# Patient Record
Sex: Female | Born: 1985
Health system: Southern US, Community
[De-identification: ages and names within clinical notes are randomized; demographics above are authoritative.]

## PROBLEM LIST (undated history)

## (undated) DIAGNOSIS — R51 Headache: Secondary | ICD-10-CM

## (undated) DIAGNOSIS — Z8489 Family history of other specified conditions: Secondary | ICD-10-CM

## (undated) DIAGNOSIS — G43909 Migraine, unspecified, not intractable, without status migrainosus: Secondary | ICD-10-CM

## (undated) DIAGNOSIS — Z8619 Personal history of other infectious and parasitic diseases: Secondary | ICD-10-CM

## (undated) DIAGNOSIS — Z87440 Personal history of urinary (tract) infections: Secondary | ICD-10-CM

## (undated) DIAGNOSIS — B019 Varicella without complication: Secondary | ICD-10-CM

## (undated) DIAGNOSIS — R519 Headache, unspecified: Secondary | ICD-10-CM

## (undated) HISTORY — PX: APPENDECTOMY: SHX54

## (undated) HISTORY — PX: TONSILLECTOMY: SUR1361

## (undated) HISTORY — DX: Personal history of other infectious and parasitic diseases: Z86.19

## (undated) HISTORY — PX: SPONTANEOUS HIP DISLOCATION REPAIR: SHX392

## (undated) HISTORY — DX: Personal history of urinary (tract) infections: Z87.440

## (undated) HISTORY — PX: FEMUR SURGERY: SHX943

## (undated) HISTORY — DX: Migraine, unspecified, not intractable, without status migrainosus: G43.909

## (undated) HISTORY — DX: Varicella without complication: B01.9

---
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
## 2006-12-13 ENCOUNTER — Encounter: Payer: Self-pay | Admitting: Family Medicine

## 2007-05-17 ENCOUNTER — Emergency Department (HOSPITAL_COMMUNITY): Admission: EM | Admit: 2007-05-17 | Discharge: 2007-05-17 | Payer: Self-pay | Admitting: Family Medicine

## 2007-08-08 ENCOUNTER — Encounter: Admission: RE | Admit: 2007-08-08 | Discharge: 2007-08-08 | Payer: Self-pay | Admitting: Gastroenterology

## 2008-03-29 LAB — CONVERTED CEMR LAB: Pap Smear: NORMAL

## 2008-10-27 ENCOUNTER — Ambulatory Visit: Payer: Self-pay | Admitting: Family Medicine

## 2009-09-20 ENCOUNTER — Encounter: Payer: Self-pay | Admitting: Family Medicine

## 2011-03-16 NOTE — H&P (Signed)
NAMEGINIA, Laura Gross NO.:  [id number]   MEDICAL RECORD NO.:  [id number]          PATIENT TYPE:  EMS   LOCATION:  ED                           FACILITY:  [facility]   PHYSICIAN:  Ollen Gross. Vernell Morgans, M.D. DATE OF BIRTH:  03-Apr-1986   DATE OF ADMISSION:  04/23/2005  DATE OF DISCHARGE:                                HISTORY & PHYSICAL   [name] is a 25 year old white female who presents with right lower  quadrant pain that started yesterday morning.  She denies any fevers or  chills.  No nausea.  She had one episode of vomiting.  No diarrhea or  dysuria.  Her white count has been normal.  Her other review of systems is  unremarkable.   PAST MEDICAL HISTORY:  None.   PAST SURGICAL HISTORY:  Significant for open reduction/internal fixation of  the left leg.   MEDICATIONS:  None.   ALLERGIES:  PENICILLIN.   SOCIAL HISTORY:  She denies the use of alcohol or tobacco products.   FAMILY HISTORY:  Noncontributory.   PHYSICAL EXAMINATION:  VITAL SIGNS:  Temperature 98, blood pressure 124/70,  pulse 79.  GENERAL:  She is a well-developed and well-nourished young white female in  no acute distress.  SKIN:  Warm and dry with no jaundice.  HEENT:  Extraocular muscles are intact.  Pupils are equal, round and  reactive to light.  Sclerae are anicteric.  LUNGS:  Clear bilaterally with no use of accessory respiratory muscles.  HEART:  Regular rate and rhythm with an impulse in the left chest.  ABDOMEN:  Soft.  She has some focal right lower quadrant pain but no  guarding or peritoneal signs.  No palpable mass or hepatosplenomegaly.  EXTREMITIES:  No clubbing, cyanosis or edema.  Good strength in her arms and  legs.  PSYCHOLOGICAL:  She is alert and oriented x 3 with no evidence of anxiety or  depression.   On review of her lab work, her white count was normal at 5000.  On reviewing  her CT scan, it does show an enlarged, inflamed appendix, consistent with  acute  appendicitis.   ASSESSMENT/PLAN:  This is a 25 year old white female with acute  appendicitis.  I have recommended appendectomy tonight.  I have discussed  with her and her family the risks and benefits of the operation as well as  some of the technical aspects, and she understands and wishes to proceed.  We will obtain routine preoperatively lab work and preparation will be  performed tonight.       PST/MEDQ  D:  04/23/2005  T:  04/23/2005  Job:  [id number]

## 2011-03-16 NOTE — Op Note (Signed)
NAMEAREEJ, TAYLER NO.:  [id number]   MEDICAL RECORD NO.:  [id number]          PATIENT TYPE:  INP   LOCATION:  1409                         FACILITY:  [facility]   PHYSICIAN:  Ollen Gross. Vernell Morgans, M.D. DATE OF BIRTH:  1986/04/24   DATE OF PROCEDURE:  04/23/2005  DATE OF DISCHARGE:                                 OPERATIVE REPORT   PREOPERATIVE DIAGNOSIS:  Acute appendicitis.   POSTOPERATIVE DIAGNOSIS:  Acute appendicitis.   PROCEDURES:  Laparoscopic appendectomy.   SURGEON:  Ollen Gross. Carolynne Edouard, M.D.   ANESTHESIA:  General endotracheal.   DESCRIPTION OF PROCEDURE:  After informed consent was obtained, the patient  was brought to the operating room, placed in the supine position on the  operating room table. After adequate induction of general anesthesia, the  patient's abdomen was prepped with Betadine and draped in the usual sterile  manner. The area below the umbilicus was infiltrated with 0.25% Marcaine. A  small incision was made with a 15 blade knife. This incision was carried  down through the subcutaneous tissue bluntly with a hemostat and [employer]  retractors until the linea alba was identified. The linea alba was incised  with a 15 blade knife. Each side was grasped with Kocher clamps and elevated  anteriorly. The preperitoneal space was then probed bluntly with a hemostat  to the peritoneum was opened and access was gained to the abdominal cavity.  A #0 Vicryl pursestring stitch was placed in the fascia surrounding the  opening, a Hasson cannula was placed through the opening and anchored in  place with the previously placed Vicryl pursestring stitch. The abdomen was  then insufflated with carbon dioxide without difficulty. The patient was  placed in Trendelenburg position, rotated slightly with the right side up.  The laparoscope was inserted through the Hasson cannula and the right lower  quadrant was inspected. Next the suprapubic area was infiltrated with  0.25%  Marcaine and a small incision was made with a 15 blade knife and 10 mm port  was placed bluntly through this incision into the abdominal cavity under  direct vision. Just in between the two ports was another site chosen for a 5  mm port. This area was infiltrated with 0.25% Marcaine and a small incision  was made with a 15 blade knife and a 5 mm port was placed bluntly through  this incision into the abdominal cavity under direct vision. The laparoscope  was then moved to the suprapubic port. A Glassman grasper and harmonic  scalpel were then used to examine the right lower quadrant. The right lower  quadrant had a small area of terminal ileum that appeared to be adhesed to  the pelvic sidewall but the bowel itself appeared to be normal. The appendix  was identified ad appeared to be slightly inflamed and enlarged. The  appendix was able to be grasped with a Glassman grasper and elevated  anteriorly. The mesoappendix was taken down sharply with the harmonic  scalpel until the base of the appendix at its junction with the cecum was  clear of any tissue. Next, as  the appendix was held anteriorly, a  laparoscopic GIA-45 stapler with a blue load was placed through the Hasson  cannula and across the base of the appendix at its junction with the cecum  and clamped. A minute was allowed to pass,  the stapling device was then  fired thereby dividing the base of the appendix between staple lines. Next a  laparoscopic bag was placed through the Hasson cannula and the appendix was  placed within the bag and bag was sealed. The right lower quadrant was  inspected. The staple line appeared to be hemostatic and healthy. The  abdomen was then irrigated with copious amounts of saline. The bag with the  appendix was then removed through the infraumbilical port without  difficulty. The fascial defect was closed with the previously placed Vicryl  pursestring stitch as well as with another  figure-of-eight #0 Vicryl stitch.  The rest of the ports were then removed under direct vision and the gas was  allowed to escape. All the ports were hemostatic. The  skin incisions were all closed with interrupted 4-0 Monocryl subcuticular  stitches. Benzoin and Steri-Strips were applied. The patient tolerated the  procedure well. At the end of case, all needle, sponge and instrument counts  were correct. The patient was then awakened and taken to the recovery room  in stable condition.       PST/MEDQ  D:  04/24/2005  T:  04/24/2005  Job:  [id number]

## 2014-07-28 LAB — OB RESULTS CONSOLE HIV ANTIBODY (ROUTINE TESTING): HIV: NONREACTIVE

## 2014-07-28 LAB — OB RESULTS CONSOLE GC/CHLAMYDIA
CHLAMYDIA, DNA PROBE: NEGATIVE
GC PROBE AMP, GENITAL: NEGATIVE

## 2014-07-28 LAB — OB RESULTS CONSOLE RPR: RPR: NONREACTIVE

## 2014-07-28 LAB — OB RESULTS CONSOLE ABO/RH: "RH Type ": POSITIVE

## 2014-07-28 LAB — OB RESULTS CONSOLE HEPATITIS B SURFACE ANTIGEN: Hepatitis B Surface Ag: NEGATIVE

## 2014-07-28 LAB — OB RESULTS CONSOLE RUBELLA ANTIBODY, IGM: RUBELLA: IMMUNE

## 2014-07-28 LAB — OB RESULTS CONSOLE ANTIBODY SCREEN: Antibody Screen: NEGATIVE

## 2014-09-10 ENCOUNTER — Other Ambulatory Visit: Payer: Self-pay | Admitting: Family Medicine

## 2014-09-10 DIAGNOSIS — Z8279 Family history of other congenital malformations, deformations and chromosomal abnormalities: Secondary | ICD-10-CM

## 2014-09-14 ENCOUNTER — Other Ambulatory Visit (HOSPITAL_COMMUNITY): Payer: Self-pay | Admitting: Obstetrics & Gynecology

## 2014-09-14 DIAGNOSIS — Z8279 Family history of other congenital malformations, deformations and chromosomal abnormalities: Secondary | ICD-10-CM

## 2014-09-28 ENCOUNTER — Telehealth: Payer: Self-pay

## 2014-09-28 NOTE — Telephone Encounter (Signed)
Melissa left v/m requesting cb; [facility]

## 2014-09-28 NOTE — Telephone Encounter (Signed)
Melissa with [place] [facility] with preregistration left v/m requesting contact # for pt. Left v/m requesting cb.

## 2014-09-28 NOTE — Telephone Encounter (Signed)
Melissa called back and spoke with Amber and contact # given as requested.

## 2014-10-01 ENCOUNTER — Encounter (HOSPITAL_COMMUNITY): Payer: Self-pay | Admitting: Obstetrics & Gynecology

## 2014-10-01 ENCOUNTER — Ambulatory Visit (HOSPITAL_COMMUNITY): Admission: RE | Admit: 2014-10-01 | Payer: Self-pay | Source: Ambulatory Visit

## 2014-10-01 ENCOUNTER — Ambulatory Visit (HOSPITAL_COMMUNITY): Payer: Self-pay

## 2014-10-01 ENCOUNTER — Encounter (HOSPITAL_COMMUNITY): Payer: Self-pay

## 2014-10-01 ENCOUNTER — Ambulatory Visit (HOSPITAL_COMMUNITY)
Admission: RE | Admit: 2014-10-01 | Discharge: 2014-10-01 | Disposition: A | Discharge status: Home / Self Care | Payer: Managed Care, Other (non HMO) | Source: Ambulatory Visit | Attending: Obstetrics & Gynecology | Admitting: Obstetrics & Gynecology

## 2014-10-01 DIAGNOSIS — Z3A18 18 weeks gestation of pregnancy: Secondary | ICD-10-CM | POA: Insufficient documentation

## 2014-10-01 DIAGNOSIS — Z36 Encounter for antenatal screening of mother: Secondary | ICD-10-CM | POA: Insufficient documentation

## 2014-10-01 DIAGNOSIS — Z8279 Family history of other congenital malformations, deformations and chromosomal abnormalities: Secondary | ICD-10-CM | POA: Insufficient documentation

## 2014-10-01 HISTORY — DX: Headache, unspecified: R51.9

## 2014-10-01 HISTORY — DX: Headache: R51

## 2014-10-01 LAB — US OB DETAIL + 14 WK

## 2014-10-15 ENCOUNTER — Other Ambulatory Visit (HOSPITAL_COMMUNITY): Payer: Self-pay

## 2014-10-29 NOTE — L&D Delivery Note (Signed)
SVD of VFI at 0522 on 03/10/15.  APGARs 9,9.  EBL 200cc.  Placenta to L&D. Head delivered OA followed atraumatically by body.  Cord was clamped, cut and baby to abdomen.  Cord blood was obtained.  Placenta delivered S/I/3VC.  Fundus was firmed with pitocin and massage.  2nd degree perineal laceration repaired with 3-0 Rapide.  Mom and baby stable.  Mitchel HonourMegan Kerigan Narvaez, DO

## 2014-11-13 ENCOUNTER — Inpatient Hospital Stay (HOSPITAL_COMMUNITY): Admission: AD | Admit: 2014-11-13 | Payer: Self-pay | Source: Ambulatory Visit | Admitting: Obstetrics & Gynecology

## 2014-11-16 ENCOUNTER — Other Ambulatory Visit (HOSPITAL_COMMUNITY): Payer: Self-pay

## 2015-01-05 ENCOUNTER — Encounter (HOSPITAL_COMMUNITY): Payer: Self-pay

## 2015-03-04 ENCOUNTER — Inpatient Hospital Stay (HOSPITAL_COMMUNITY): Admission: AD | Admit: 2015-03-04 | Payer: Self-pay | Source: Ambulatory Visit | Admitting: Obstetrics & Gynecology

## 2015-03-08 ENCOUNTER — Encounter (HOSPITAL_COMMUNITY): Payer: Self-pay | Admitting: *Deleted

## 2015-03-08 ENCOUNTER — Telehealth (HOSPITAL_COMMUNITY): Payer: Self-pay | Admitting: *Deleted

## 2015-03-08 LAB — OB RESULTS CONSOLE GBS: GBS: NEGATIVE

## 2015-03-08 NOTE — Telephone Encounter (Signed)
Preadmission screen  

## 2015-03-09 ENCOUNTER — Inpatient Hospital Stay (HOSPITAL_COMMUNITY): Payer: Managed Care, Other (non HMO) | Admitting: Anesthesiology

## 2015-03-09 ENCOUNTER — Inpatient Hospital Stay (HOSPITAL_COMMUNITY)
Admission: RE | Admit: 2015-03-09 | Discharge: 2015-03-11 | DRG: 775 | Disposition: A | Discharge status: Home / Self Care | Payer: Managed Care, Other (non HMO) | Source: Ambulatory Visit | Attending: Obstetrics & Gynecology | Admitting: Obstetrics & Gynecology

## 2015-03-09 DIAGNOSIS — O480 Post-term pregnancy: Principal | ICD-10-CM | POA: Diagnosis present

## 2015-03-09 DIAGNOSIS — Z3403 Encounter for supervision of normal first pregnancy, third trimester: Secondary | ICD-10-CM | POA: Diagnosis present

## 2015-03-09 DIAGNOSIS — Z3490 Encounter for supervision of normal pregnancy, unspecified, unspecified trimester: Secondary | ICD-10-CM

## 2015-03-09 DIAGNOSIS — Z3A40 40 weeks gestation of pregnancy: Secondary | ICD-10-CM | POA: Diagnosis present

## 2015-03-09 LAB — ABO/RH: ABO/RH(D): O POS

## 2015-03-09 LAB — TYPE AND SCREEN
ABO/RH(D): O POS
ANTIBODY SCREEN: NEGATIVE

## 2015-03-09 LAB — CBC
HEMATOCRIT: 34.8 % — AB (ref 36.0–46.0)
Hemoglobin: 12.2 g/dL (ref 12.0–15.0)
MCH: 31.4 pg (ref 26.0–34.0)
MCHC: 35.1 g/dL (ref 30.0–36.0)
MCV: 89.5 fL (ref 78.0–100.0)
Platelets: 175 10*3/uL (ref 150–400)
RBC: 3.89 MIL/uL (ref 3.87–5.11)
RDW: 13.4 % (ref 11.5–15.5)
WBC: 10.3 10*3/uL (ref 4.0–10.5)

## 2015-03-09 MED ORDER — LIDOCAINE HCL (PF) 1 % IJ SOLN
30.0000 mL | INTRAMUSCULAR | Status: DC | PRN
  Filled 2015-03-09: qty 30

## 2015-03-09 MED ORDER — EPHEDRINE 5 MG/ML INJ
10.0000 mg | INTRAVENOUS | Status: DC | PRN
  Filled 2015-03-09: qty 2

## 2015-03-09 MED ORDER — ZOLPIDEM TARTRATE 5 MG PO TABS: 5.0000 mg | ORAL_TABLET | Freq: Every evening | ORAL | Status: DC | PRN

## 2015-03-09 MED ORDER — DIPHENHYDRAMINE HCL 50 MG/ML IJ SOLN: 12.5000 mg | INTRAMUSCULAR | Status: DC | PRN

## 2015-03-09 MED ORDER — OXYTOCIN 40 UNITS IN LACTATED RINGERS INFUSION - SIMPLE MED
62.5000 mL/h | INTRAVENOUS | Status: DC
  Administered 2015-03-10: 62.5 mL/h via INTRAVENOUS

## 2015-03-09 MED ORDER — ACETAMINOPHEN 325 MG PO TABS: 650.0000 mg | ORAL_TABLET | ORAL | Status: DC | PRN

## 2015-03-09 MED ORDER — PHENYLEPHRINE 40 MCG/ML (10ML) SYRINGE FOR IV PUSH (FOR BLOOD PRESSURE SUPPORT)
80.0000 ug | PREFILLED_SYRINGE | INTRAVENOUS | Status: DC | PRN
  Administered 2015-03-09: 80 ug via INTRAVENOUS
  Filled 2015-03-09: qty 2
  Filled 2015-03-09: qty 20

## 2015-03-09 MED ORDER — FENTANYL 2.5 MCG/ML BUPIVACAINE 1/10 % EPIDURAL INFUSION (WH - ANES)
14.0000 mL/h | INTRAMUSCULAR | Status: DC | PRN
  Administered 2015-03-09 – 2015-03-10 (×2): 14 mL/h via EPIDURAL
  Filled 2015-03-09 (×2): qty 125

## 2015-03-09 MED ORDER — OXYTOCIN BOLUS FROM INFUSION
500.0000 mL | INTRAVENOUS | Status: DC
  Administered 2015-03-10: 500 mL via INTRAVENOUS

## 2015-03-09 MED ORDER — CITRIC ACID-SODIUM CITRATE 334-500 MG/5ML PO SOLN: 30.0000 mL | ORAL | Status: DC | PRN

## 2015-03-09 MED ORDER — LACTATED RINGERS IV SOLN
INTRAVENOUS | Status: DC
  Administered 2015-03-09 (×3): via INTRAVENOUS

## 2015-03-09 MED ORDER — LIDOCAINE HCL (PF) 1 % IJ SOLN
INTRAMUSCULAR | Status: DC | PRN
  Administered 2015-03-09: 6 mL
  Administered 2015-03-09: 4 mL

## 2015-03-09 MED ORDER — TERBUTALINE SULFATE 1 MG/ML IJ SOLN: 0.2500 mg | Freq: Once | INTRAMUSCULAR | Status: AC | PRN

## 2015-03-09 MED ORDER — OXYTOCIN 40 UNITS IN LACTATED RINGERS INFUSION - SIMPLE MED
1.0000 m[IU]/min | INTRAVENOUS | Status: DC
  Administered 2015-03-09: 2 m[IU]/min via INTRAVENOUS
  Filled 2015-03-09: qty 1000

## 2015-03-09 MED ORDER — ONDANSETRON HCL 4 MG/2ML IJ SOLN: 4.0000 mg | Freq: Four times a day (QID) | INTRAMUSCULAR | Status: DC | PRN

## 2015-03-09 MED ORDER — LACTATED RINGERS IV SOLN
500.0000 mL | INTRAVENOUS | Status: DC | PRN
  Administered 2015-03-09 (×2): 500 mL via INTRAVENOUS

## 2015-03-09 MED ORDER — FENTANYL 2.5 MCG/ML BUPIVACAINE 1/10 % EPIDURAL INFUSION (WH - ANES): 14.0000 mL/h | INTRAMUSCULAR | Status: DC | PRN

## 2015-03-09 MED ORDER — FLEET ENEMA 7-19 GM/118ML RE ENEM: 1.0000 | ENEMA | RECTAL | Status: DC | PRN

## 2015-03-09 MED ORDER — OXYCODONE-ACETAMINOPHEN 5-325 MG PO TABS: 1.0000 | ORAL_TABLET | ORAL | Status: DC | PRN

## 2015-03-09 MED ORDER — BUTORPHANOL TARTRATE 1 MG/ML IJ SOLN
1.0000 mg | INTRAMUSCULAR | Status: DC | PRN
  Administered 2015-03-09: 1 mg via INTRAVENOUS
  Filled 2015-03-09: qty 1

## 2015-03-09 MED ORDER — OXYCODONE-ACETAMINOPHEN 5-325 MG PO TABS: 2.0000 | ORAL_TABLET | ORAL | Status: DC | PRN

## 2015-03-09 NOTE — H&P (Signed)
Laura GarretStaci Gross is a [age] y.o. female presenting for post-dates induction.  FOB has congenital cardiac defect; patient declined fetal ECHO.  Otherwise, uncomplicated antepartum course.  GBS negative.  Maternal Medical History:  Fetal activity: Perceived fetal activity is normal.   Last perceived fetal movement was within the past hour.    Prenatal complications: no prenatal complications Prenatal Complications - Diabetes: none.    OB History    Gravida Para Term Preterm AB TAB SAB Ectopic Multiple Living   1 0 0 0 0 0 0 0 0 0      Past Medical History  Diagnosis Date  . Headache   . Hx of varicella    Past Surgical History  Procedure Laterality Date  . Appendectomy    . Tonsillectomy    . Femur surgery    . Spontaneous hip dislocation repair     Family History: family history includes Cancer in her maternal grandfather, maternal grandmother, and paternal grandmother; Diabetes in her paternal grandmother; Hyperlipidemia in her mother. Social History:  reports that she has never smoked. She has never used smokeless tobacco. She reports that she does not drink alcohol or use illicit drugs.   Prenatal Transfer Tool  Maternal Diabetes: No Genetic Screening: Normal Maternal Ultrasounds/Referrals: Normal Fetal Ultrasounds or other Referrals:  None Maternal Substance Abuse:  No Significant Maternal Medications:  None Significant Maternal Lab Results:  Lab values include: Group B Strep negative Other Comments:  None  ROS    Blood pressure 140/80, pulse 85, temperature 98.2 F (36.8 C), temperature source Oral, height 5\' 7"  (1.702 m), weight 237 lb (107.502 kg), last menstrual period 05/28/2014. Maternal Exam:  Abdomen: Fundal height is c/w dates.   Estimated fetal weight is 8#.       Physical Exam  Constitutional: She is oriented to person, place, and time. She appears well-developed and well-nourished.  GI: Soft. There is no rebound and no guarding.  Neurological:  She is alert and oriented to person, place, and time.  Skin: Skin is warm and dry.  Psychiatric: She has a normal mood and affect. Her behavior is normal.    Prenatal labs: ABO, Rh: O/Positive/-- (09/30 0000) Antibody: Negative (09/30 0000) Rubella: Immune (09/30 0000) RPR: Nonreactive (09/30 0000)  HBsAg: Negative (09/30 0000)  HIV: Non-reactive (09/30 0000)  GBS: Negative (05/10 0000)   Assessment/Plan: 29yo G1 at 10080w5d for PDI -Start pitocin -AROM when able -Epidural when ready   Sekai Nayak 03/09/2015, 2:39 PM

## 2015-03-09 NOTE — Progress Notes (Signed)
AROM, clear 2-3/75/-2  Continue pit Epidural if desired  Mitchel HonourMegan Jamieon Lannen, DO

## 2015-03-09 NOTE — Anesthesia Procedure Notes (Signed)

## 2015-03-09 NOTE — Anesthesia Preprocedure Evaluation (Addendum)

## 2015-03-10 ENCOUNTER — Inpatient Hospital Stay (HOSPITAL_COMMUNITY): Payer: 59

## 2015-03-10 ENCOUNTER — Encounter (HOSPITAL_COMMUNITY): Payer: Self-pay

## 2015-03-10 LAB — RPR: RPR: NONREACTIVE

## 2015-03-10 MED ORDER — OXYCODONE-ACETAMINOPHEN 5-325 MG PO TABS: 1.0000 | ORAL_TABLET | ORAL | Status: DC | PRN

## 2015-03-10 MED ORDER — PRENATAL MULTIVITAMIN CH
1.0000 | ORAL_TABLET | Freq: Every day | ORAL | Status: DC
  Administered 2015-03-11: 1 via ORAL
  Filled 2015-03-10: qty 1

## 2015-03-10 MED ORDER — ZOLPIDEM TARTRATE 5 MG PO TABS: 5.0000 mg | ORAL_TABLET | Freq: Every evening | ORAL | Status: DC | PRN

## 2015-03-10 MED ORDER — ACETAMINOPHEN 325 MG PO TABS: 650.0000 mg | ORAL_TABLET | ORAL | Status: DC | PRN

## 2015-03-10 MED ORDER — LANOLIN HYDROUS EX OINT: TOPICAL_OINTMENT | CUTANEOUS | Status: DC | PRN

## 2015-03-10 MED ORDER — TETANUS-DIPHTH-ACELL PERTUSSIS 5-2.5-18.5 LF-MCG/0.5 IM SUSP: 0.5000 mL | Freq: Once | INTRAMUSCULAR | Status: DC

## 2015-03-10 MED ORDER — ONDANSETRON HCL 4 MG/2ML IJ SOLN: 4.0000 mg | INTRAMUSCULAR | Status: DC | PRN

## 2015-03-10 MED ORDER — DIBUCAINE 1 % RE OINT: 1.0000 "application " | TOPICAL_OINTMENT | RECTAL | Status: DC | PRN

## 2015-03-10 MED ORDER — IBUPROFEN 600 MG PO TABS
600.0000 mg | ORAL_TABLET | Freq: Four times a day (QID) | ORAL | Status: DC
  Administered 2015-03-10 – 2015-03-11 (×6): 600 mg via ORAL
  Filled 2015-03-10 (×6): qty 1

## 2015-03-10 MED ORDER — WITCH HAZEL-GLYCERIN EX PADS: 1.0000 "application " | MEDICATED_PAD | CUTANEOUS | Status: DC | PRN

## 2015-03-10 MED ORDER — SIMETHICONE 80 MG PO CHEW: 80.0000 mg | CHEWABLE_TABLET | ORAL | Status: DC | PRN

## 2015-03-10 MED ORDER — DIPHENHYDRAMINE HCL 25 MG PO CAPS: 25.0000 mg | ORAL_CAPSULE | Freq: Four times a day (QID) | ORAL | Status: DC | PRN

## 2015-03-10 MED ORDER — ONDANSETRON HCL 4 MG PO TABS: 4.0000 mg | ORAL_TABLET | ORAL | Status: DC | PRN

## 2015-03-10 MED ORDER — OXYCODONE-ACETAMINOPHEN 5-325 MG PO TABS: 2.0000 | ORAL_TABLET | ORAL | Status: DC | PRN

## 2015-03-10 MED ORDER — BENZOCAINE-MENTHOL 20-0.5 % EX AERO
1.0000 "application " | INHALATION_SPRAY | CUTANEOUS | Status: DC | PRN
  Administered 2015-03-10: 1 via TOPICAL
  Filled 2015-03-10: qty 56

## 2015-03-10 MED ORDER — SENNOSIDES-DOCUSATE SODIUM 8.6-50 MG PO TABS
2.0000 | ORAL_TABLET | ORAL | Status: DC
  Administered 2015-03-10: 2 via ORAL
  Filled 2015-03-10: qty 2

## 2015-03-10 NOTE — Anesthesia Postprocedure Evaluation (Signed)
Anesthesia Post Note  Patient: Laura GarretStaci Gafford-Trevino  Procedure(s) Performed: * No procedures listed *  Anesthesia type: Epidural  Patient location: Mother/Baby  Post pain: Pain level controlled  Post assessment: Post-op Vital signs reviewed  Last Vitals:  Filed Vitals:   03/10/15 0631  BP: 132/76  Pulse: 86  Temp:   Resp:     Post vital signs: Reviewed  Level of consciousness: awake  Complications: No apparent anesthesia complications

## 2015-03-10 NOTE — Lactation Note (Signed)
This note was copied from the chart of Laura Gross. Lactation Consultation Note: Called to assist mom with latch- she reports that baby has been feeding well on the right breast for about 20 min, grandmother changing diaper as I went into room. Assisted mom with football hold on left breast- baby latched well nursed for 5 mins then off to sleep. Mom concerned that baby has not voided yet- reassurance given. No questions at present. To call prn  Patient Name: Laura [name] ZOXWR'UToday's Date: 03/10/2015 Reason for consult: Follow-up assessment   Maternal Data Formula Feeding for Exclusion: No Does the patient have breastfeeding experience prior to this delivery?: No  Feeding Feeding Type: Breast Fed Length of feed: 5 min  LATCH Score/Interventions Latch: Grasps breast easily, tongue down, lips flanged, rhythmical sucking.  Audible Swallowing: A few with stimulation  Type of Nipple: Everted at rest and after stimulation  Comfort (Breast/Nipple): Soft / non-tender     Hold (Positioning): Assistance needed to correctly position infant at breast and maintain latch. Intervention(s): Breastfeeding basics reviewed  LATCH Score: 8  Lactation Tools Discussed/Used     Consult Status Consult Status: Follow-up Date: 03/11/15 Follow-up type: In-patient    Pamelia HoitWeeks, Curstin Schmale D 03/10/2015, 2:40 PM

## 2015-03-10 NOTE — Progress Notes (Signed)
Post Partum Day 0 Subjective: no complaints, up ad lib, voiding and tolerating PO  Objective: Blood pressure 132/76, pulse 86, temperature 98.6 F (37 C), temperature source Oral, resp. rate 16, height 5\' 7"  (1.702 m), weight 237 lb (107.502 kg), last menstrual period 05/28/2014, SpO2 100 %, unknown if currently breastfeeding.  Physical Exam:  General: alert and cooperative Lochia: appropriate Uterine Fundus: firm Incision: healing well DVT Evaluation: No evidence of DVT seen on physical exam. Negative Homan's sign. No cords or calf tenderness. Calf/Ankle edema is present.   Recent Labs  03/09/15 1530  HGB 12.2  HCT 34.8*    Assessment/Plan: Plan for discharge tomorrow   LOS: 1 day   Kelli Robeck G 03/10/2015, 7:43 AM

## 2015-03-10 NOTE — Lactation Note (Signed)
This note was copied from the chart of Laura Marirose Gross. Lactation Consultation Note: Initial visit with mom- baby asleep in dad's arms at present. Mom reports that baby fed for 30 min after delivery with no pain. Reviewed feeding cues and encouraged to feed whenever she sees them. Attempted to wake baby but too sleepy. No questions at present. BF brochure given- reviewed [facility] and OP appointments as resources for support after DC. To call for assist prn  Patient Name: Laura [name] ZOXWR'UToday's Date: 03/10/2015 Reason for consult: Initial assessment   Maternal Data Formula Feeding for Exclusion: No Has patient been taught Hand Expression?: Yes Does the patient have breastfeeding experience prior to this delivery?: No  Feeding    LATCH Score/Interventions                      Lactation Tools Discussed/Used     Consult Status Consult Status: Follow-up Date: 03/11/15 Follow-up type: In-patient    Laura Gross, Laura Gross 03/10/2015, 11:15 AM

## 2015-03-11 LAB — CBC
HEMATOCRIT: 29.2 % — AB (ref 36.0–46.0)
Hemoglobin: 10 g/dL — ABNORMAL LOW (ref 12.0–15.0)
MCH: 30.8 pg (ref 26.0–34.0)
MCHC: 34.2 g/dL (ref 30.0–36.0)
MCV: 89.8 fL (ref 78.0–100.0)
PLATELETS: 139 10*3/uL — AB (ref 150–400)
RBC: 3.25 MIL/uL — ABNORMAL LOW (ref 3.87–5.11)
RDW: 13.5 % (ref 11.5–15.5)
WBC: 8.5 10*3/uL (ref 4.0–10.5)

## 2015-03-11 NOTE — Discharge Summary (Signed)
Obstetric Discharge Summary Reason for Admission: induction of labor Prenatal Procedures: ultrasound Intrapartum Procedures: spontaneous vaginal delivery Postpartum Procedures: none Complications-Operative and Postpartum: 2 degree perineal laceration HEMOGLOBIN  Date Value Ref Range Status  03/11/2015 10.0* 12.0 - 15.0 g/dL Final   HCT  Date Value Ref Range Status  03/11/2015 29.2* 36.0 - 46.0 % Final    Physical Exam:  General: alert and cooperative Lochia: appropriate Uterine Fundus: firm Incision: healing well DVT Evaluation: No evidence of DVT seen on physical exam. Negative Homan's sign. No cords or calf tenderness. No significant calf/ankle edema.  Discharge Diagnoses: Term Pregnancy-delivered  Discharge Information: Date: 03/11/2015 Activity: pelvic rest Diet: routine Medications: PNV and Ibuprofen Condition: stable Instructions: refer to practice specific booklet Discharge to: home   Newborn Data: Live born female  Birth Weight: 6 lb 5.2 oz (2870 g) APGAR: 9, 9  Baby under phototherapy.     Krystle Oberman G 03/11/2015, 8:22 AM

## 2015-03-12 ENCOUNTER — Ambulatory Visit: Payer: Self-pay

## 2015-03-12 NOTE — Lactation Note (Signed)
This note was copied from the chart of Laura Taquisha Gross. Lactation Consultation Note  Baby is feeding at the breast often.  Mom is pumping and offering any expressed BM after feedings.  I observed baby at the breast and noted that she appears to be chewing. Some swallows are noted.   Mom is sore and using comfort gels.  Going home on phototherapy so there will be a weight check tomorrow.  Encouraged feeding on cue, pumping and supplementing with expressed BM,  OUtpatient appointment scheduled for Wednesday.  Patient Name: Laura [name] WUJWJ'XToday's Date: 03/12/2015     Maternal Data    Feeding Feeding Type: Breast Fed  LATCH Score/Interventions Latch: Grasps breast easily, tongue down, lips flanged, rhythmical sucking.  Audible Swallowing: A few with stimulation Intervention(s): Skin to skin  Type of Nipple: Everted at rest and after stimulation  Comfort (Breast/Nipple): Filling, red/small blisters or bruises, mild/mod discomfort  Problem noted: Mild/Moderate discomfort Interventions (Mild/moderate discomfort): Pre-pump if needed  Hold (Positioning): No assistance needed to correctly position infant at breast. Intervention(s): Breastfeeding basics reviewed  LATCH Score: 8  Lactation Tools Discussed/Used     Consult Status      Soyla DryerJoseph, Shanzay Hepworth 03/12/2015, 11:53 AM

## 2015-03-15 ENCOUNTER — Ambulatory Visit (HOSPITAL_COMMUNITY)
Admission: RE | Admit: 2015-03-15 | Discharge: 2015-03-15 | Disposition: A | Discharge status: Home / Self Care | Payer: Managed Care, Other (non HMO) | Source: Ambulatory Visit | Attending: Obstetrics & Gynecology | Admitting: Obstetrics & Gynecology

## 2015-03-15 NOTE — Lactation Note (Addendum)
Lactation Consult  Mother's reason for visit:  Follow up appt was scheduled on day of discharge. Infant had an 8% weight loss on discharge.  Visit Type:feeding assessment    Appointment Notes: Mother states that infant lost 8% by day 3 of life. Mother states that infant is feeding well every 2-3 hours for 30-45 mins. Mother states that infant has regained  to 6-3. She was seen by Dr Athena MasseBonney yesterday for weight check. Infant was Jaundice and was on doublephoto tx at home. Mother states that she supplemented with formula until milk came in on day 4.  Consult:  Initial Lactation Consultant:  Michel BickersKendrick, Cherese Lozano McCoy  ________________________________________________________________________  Laura FloresBaby's Name: Laura Gross Date of Birth: 03/10/2014 Pediatrician:Dr Athena MasseBonney Gender: female Gestational Age: 3872w6d (At Birth) Birth Weight: 6 lb 5.2 oz (2870 g) Weight at Discharge: Weight: 5 lb 14 oz (2665 g)Date of Discharge: 03/12/2015 Filed Weights   03/10/15 0522 03/10/15 2300 03/11/15 2300  Weight: 6 lb 5.2 oz (2870 g) 6 lb 2.6 oz (2795 g) 5 lb 14 oz (2665 g)   Weight today: 6-4, 2836     Admission Information     Provider Service Admission Date    Vivia BirminghamAngela C Hartsell, MD Newborn 03/10/2015          ADT Events       Unit Room Bed Service Event    03/10/15 0522 [facility] 9197 [id number] Newborn Admission    03/10/15 0533 [facility] 9197 [id number] Newborn Transfer Out    03/10/15 0533 [facility] 9150 [id number] Newborn Transfer In    03/11/15 1207 [facility] 9150 [id number] Newborn Patient Update    03/12/15 1410 [facility] 9150 [id number] Newborn Discharge      Weight Information (last 3 days) before discharge     Date/Time   Weight   BSA (Calculated - sq m)   BMI (Calculated) Who      03/11/15 2300  5 lb 14 oz (2665 g)  --  -- CM     03/10/15 2300  6 lb 2.6 oz (2795 g)  --  -- CM     03/10/15 0522  6 lb 5.2 oz (2870 g)   --  -- CB           Weights    Go to now       03/07/15 - Today         One Day Eight Hours  View All    05/12 05/12 05/13    Time:  0522 2300 2300    Weight   Weight  6 lb 5... 6 lb 2... 5 lb 1...  Weight               ________________________________________________________________________  Mother's Name: Laura Gross Type of delivery: vaginal del  Breastfeeding Experience:  none Maternal Medical Conditions:  none Maternal Medications:  Prenatal Vits, tylenol  ________________________________________________________________________  Breastfeeding History (Post Discharge)  Frequency of breastfeeding:  Every 2-3 hours Duration of feeding:  30-45 mins  Patient does not supplement or pump.  Infant Intake and Output Assessment  Voids:   in 24 hrs.  Color:  Clear yellow Stools:   in 24 hrs.  Color:  Brown and Yellow  ________________________________________________________________________  Maternal Breast Assessment - Breast:  Full Nipple:  Erect Pain level:  0 Pain interventions:  Bra  _______________________________________________________________________ Feeding Assessment/Evaluation:  Infant was last fed 2 hours ago. Mother states that Laura Gross had a good feeding at 7:30.  Mother request latch assistance. She states that when infant  initially latches she has nipple pain. Mother taught asymmetrical latch technique. Infant latched and sustained latch for 15-20 mins. Observed lots of chewing and non-nutritive suckling. Infant transferred 16 ml.   Infant's oral assessment:  Variance,  Observed that infant has a short frenula with a possible tight posterior tie. Husband had a tongue tie when he was an infant that was revised.  Positioning:  Cross cradle Left breast  LATCH documentation:  Latch:  2 = Grasps breast easily, tongue down, lips flanged, rhythmical sucking.  Audible swallowing:  2 = Spontaneous and intermittent  Type of nipple:   2 = Everted at rest and after stimulation  Comfort (Breast/Nipple):  1 = Filling, red/small blisters or bruises, mild/mod discomfort  Hold (Positioning):  1 = Assistance needed to correctly position infant at breast and maintain latch  LATCH score:  8  Attached assessment:  Shallow  Lips flanged:  No.  Lips untucked:  No.   Suck assessment:  Nutritive and Nonnutritive  Tools:  Pump Instructed on use and cleaning of tool:  No.  Pre-feed weight: 2836 g 6-4 Post-feed weight: 2852 g 6-4.6 Amount transferred: 16 ml   Additional Feeding Assessment - infant was placed on alternate breast and assist with latch technique. Infant was observed with only a few swallows with a transfer of 8 ml.   Infant's oral assessment:  Variance  Positioning:  Cross cradle Right breast  LATCH documentation:  Latch:  2 = Grasps breast easily, tongue down, lips flanged, rhythmical sucking.  Audible swallowing:  2 = Spontaneous and intermittent  Type of nipple:  2 = Everted at rest and after stimulation  Comfort (Breast/Nipple):  1 = Filling, red/small blisters or bruises, mild/mod discomfort  Hold (Positioning):  1 = Assistance needed to correctly position infant at breast and maintain latch  LATCH score:  8  Attached assessment:  Shallow  Lips flanged:  No.  Lips untucked:  No.  Suck assessment:  Nutritive and Nonnutritive    Pre-feed weight: 2852 g   Post-feed weight: 2860  g  Amount transferred:  8 ml    Total amount transferred: 24 ml  Advised mother to continue to feed infant 8-12 feedings in 24 hours as well as with feeding cues Suggested that mother work on good depth with asymmetrical latch technique Continue to post pump at least 3 times daily to increase milk volume Advised to follow up with Dr Athena MasseBonney for evaluation of posterior tongue tie and possible referral to a specialist appt with Dr Athena MasseBonney on May 19 Phone LC for appt next week or follow up for weight check at Dr office  Bonner Puna/[facility]

## 2015-03-16 ENCOUNTER — Ambulatory Visit (HOSPITAL_COMMUNITY): Payer: 59

## 2015-03-19 ENCOUNTER — Encounter (HOSPITAL_COMMUNITY): Payer: Self-pay | Admitting: *Deleted

## 2015-03-19 ENCOUNTER — Inpatient Hospital Stay (HOSPITAL_COMMUNITY)
Admission: AD | Admit: 2015-03-19 | Discharge: 2015-03-19 | Disposition: A | Discharge status: Home / Self Care | Payer: Managed Care, Other (non HMO) | Source: Ambulatory Visit | Attending: Obstetrics and Gynecology | Admitting: Obstetrics and Gynecology

## 2015-03-19 DIAGNOSIS — O8622 Infection of bladder following delivery: Secondary | ICD-10-CM | POA: Diagnosis not present

## 2015-03-19 DIAGNOSIS — O8620 Urinary tract infection following delivery, unspecified: Secondary | ICD-10-CM | POA: Insufficient documentation

## 2015-03-19 DIAGNOSIS — Z880 Allergy status to penicillin: Secondary | ICD-10-CM | POA: Diagnosis not present

## 2015-03-19 DIAGNOSIS — R109 Unspecified abdominal pain: Secondary | ICD-10-CM | POA: Diagnosis present

## 2015-03-19 LAB — CBC
HCT: 34.6 % — ABNORMAL LOW (ref 36.0–46.0)
Hemoglobin: 12.2 g/dL (ref 12.0–15.0)
MCH: 31.4 pg (ref 26.0–34.0)
MCHC: 35.3 g/dL (ref 30.0–36.0)
MCV: 88.9 fL (ref 78.0–100.0)
Platelets: 237 10*3/uL (ref 150–400)
RBC: 3.89 MIL/uL (ref 3.87–5.11)
RDW: 12.9 % (ref 11.5–15.5)
WBC: 10.8 10*3/uL — ABNORMAL HIGH (ref 4.0–10.5)

## 2015-03-19 LAB — URINALYSIS, ROUTINE W REFLEX MICROSCOPIC
GLUCOSE, UA: NEGATIVE mg/dL
KETONES UR: 15 mg/dL — AB
Nitrite: POSITIVE — AB
Protein, ur: 100 mg/dL — AB
SPECIFIC GRAVITY, URINE: 1.015 (ref 1.005–1.030)
Urobilinogen, UA: 1 mg/dL (ref 0.0–1.0)
pH: 8.5 — ABNORMAL HIGH (ref 5.0–8.0)

## 2015-03-19 LAB — URINE MICROSCOPIC-ADD ON

## 2015-03-19 MED ORDER — CIPROFLOXACIN HCL 250 MG PO TABS: 250.0000 mg | ORAL_TABLET | Freq: Two times a day (BID) | ORAL | Status: DC

## 2015-03-19 NOTE — MAU Note (Signed)
Since last night having lower abd pain and has only gotten worse. Had normal BM yesterday but hurts lower abd when have BM. Dysuria. Did home test for uti and was positive but on-call wanted her to come in due to pain. Still some vag bleeding but no foul odor and bleeding getting darker

## 2015-03-19 NOTE — Progress Notes (Signed)
Eveline KetoKathy Harris NP in to discuss d/c plan. Written and verbal d/c instructions given and understanding voiced.

## 2015-03-19 NOTE — MAU Provider Note (Signed)
History     CSN: [id number]  Arrival date and time: 03/19/15 [id number]   First Provider Initiated Contact with Patient 03/19/15 1944      Chief Complaint  Patient presents with  . Abdominal Pain   HPI  Laura Gross is a 29 y.o. G1P1001 s/p SVD 5/12 presents with c/o bil low abd pain. The pain started yesterday, has progressively gotten worse, it hurts to walk, stand up straight.  The pain increases with voiding and with BM last evening. She denies urinary frequency(only voided x2 today) and urgency, has abd pain with voiding. No fever/chills, N&V, constipation or diarrhea. Still has scant dark lochia.   OB History    Gravida Para Term Preterm AB TAB SAB Ectopic Multiple Living   1 1 1  0 0 0 0 0 0 1      Past Medical History  Diagnosis Date  . Headache   . Hx of varicella     Past Surgical History  Procedure Laterality Date  . Appendectomy    . Tonsillectomy    . Femur surgery    . Spontaneous hip dislocation repair      Family History  Problem Relation Age of Onset  . Hyperlipidemia Mother   . Cancer Maternal Grandmother     breast  . Cancer Maternal Grandfather     nelanoma  . Diabetes Paternal Grandmother   . Cancer Paternal Grandmother     lung    History  Substance Use Topics  . Smoking status: Never Smoker   . Smokeless tobacco: Never Used  . Alcohol Use: No    Allergies:  Allergies  Allergen Reactions  . Penicillins Hives    Prescriptions prior to admission  Medication Sig Dispense Refill Last Dose  . Prenatal Vit-Fe Fumarate-FA (PRENATAL MULTIVITAMIN) TABS tablet Take 1 tablet by mouth at bedtime.   03/08/2015 at Unknown time    Review of Systems  Constitutional: Negative for fever and chills.  Gastrointestinal: Positive for abdominal pain. Negative for heartburn, nausea, vomiting, diarrhea and constipation.  Genitourinary: Positive for dysuria. Negative for urgency, frequency, hematuria and flank pain.   Physical Exam   Blood  pressure 148/86, pulse 88, temperature 98.6 F (37 C), resp. rate 18, height 5\' 7"  (1.702 m), unknown if currently breastfeeding.  Physical Exam  Nursing note and vitals reviewed. Constitutional: She is oriented to person, place, and time. She appears well-developed and well-nourished.  GI: Soft. Bowel sounds are normal. She exhibits no distension. There is tenderness. There is no rebound.  Genitourinary:  Pelvic exam- Ext gen- nl anatomy,skin intact, repair intact Vagina- small amt dark red blood Cx- closed Uterus- sl tender, enlarged slightly Adn- tender bil  Musculoskeletal: Normal range of motion.  Neurological: She is alert and oriented to person, place, and time.  Skin: Skin is warm and dry.  Psychiatric: She has a normal mood and affect. Her behavior is normal.    MAU Course  Procedures Results for orders placed or performed during the hospital encounter of 03/19/15 (from the past 24 hour(s))  Urinalysis, Routine w reflex microscopic     Status: Abnormal   Collection Time: 03/19/15  7:32 PM  Result Value Ref Range   Color, Urine RED (A) YELLOW   APPearance TURBID (A) CLEAR   Specific Gravity, Urine 1.015 1.005 - 1.030   pH 8.5 (H) 5.0 - 8.0   Glucose, UA NEGATIVE NEGATIVE mg/dL   Hgb urine dipstick LARGE (A) NEGATIVE   Bilirubin Urine SMALL (A) NEGATIVE  Ketones, ur 15 (A) NEGATIVE mg/dL   Protein, ur 161 (A) NEGATIVE mg/dL   Urobilinogen, UA 1.0 0.0 - 1.0 mg/dL   Nitrite POSITIVE (A) NEGATIVE   Leukocytes, UA LARGE (A) NEGATIVE  Urine microscopic-add on     Status: Abnormal   Collection Time: 03/19/15  7:32 PM  Result Value Ref Range   Squamous Epithelial / LPF FEW (A) RARE   WBC, UA TOO NUMEROUS TO COUNT <3 WBC/hpf   RBC / HPF TOO NUMEROUS TO COUNT <3 RBC/hpf   Bacteria, UA MANY (A) RARE  CBC     Status: Abnormal   Collection Time: 03/19/15  8:00 PM  Result Value Ref Range   WBC 10.8 (H) 4.0 - 10.5 K/uL   RBC 3.89 3.87 - 5.11 MIL/uL   Hemoglobin 12.2 12.0 -  15.0 g/dL   HCT 09.6 (L) 04.5 - 40.9 %   MCV 88.9 78.0 - 100.0 fL   MCH 31.4 26.0 - 34.0 pg   MCHC 35.3 30.0 - 36.0 g/dL   RDW 81.1 91.4 - 78.2 %   Platelets 237 150 - 400 K/uL    MDM Consulted with Dr Henderson Cloud 2025, pt is allergic to PCN Cipro 250 mg bid  Assessment and Plan   Urinary tract infection post partum   Cipro 250 mg BID  Increase fluids Call with fever .100.5 or increasing S&S    Zaryan Yakubov M. 03/19/2015, 7:57 PM

## 2015-03-20 LAB — URINE CULTURE: Colony Count: 75000

## 2015-04-20 ENCOUNTER — Other Ambulatory Visit: Payer: Self-pay | Admitting: Obstetrics & Gynecology

## 2015-04-21 LAB — CYTOLOGY - PAP

## 2015-05-05 ENCOUNTER — Encounter (HOSPITAL_COMMUNITY): Payer: Self-pay | Admitting: Lactation Services

## 2015-05-05 NOTE — Progress Notes (Signed)
[facility].  Informed mother that we do not carry scales.  She wants to know how much to pump for when she goes back to work in approximately 4 weeks.  Her baby  is  388 weeks old.  Mother is currently pumping an average of 60 ml from one breast.  Suggest she post pump for 10-15 min both breasts. Did pre and post feeding weight.  Pre-weight 4430.  Post weight 4500.  Baby took 70 ml from both breasts.  Baby has gained approximately 1 lb in 4 weeks.  Reassured mother that she is doing well.  Suggest she attend Breastfeeding Support Group on Monday nights from 7pm-8pm.

## 2016-07-30 ENCOUNTER — Ambulatory Visit (INDEPENDENT_AMBULATORY_CARE_PROVIDER_SITE_OTHER): Payer: BLUE CROSS/BLUE SHIELD | Admitting: Family Medicine

## 2016-07-30 ENCOUNTER — Ambulatory Visit (INDEPENDENT_AMBULATORY_CARE_PROVIDER_SITE_OTHER): Payer: BLUE CROSS/BLUE SHIELD

## 2016-07-30 VITALS — BP 128/84 | HR 71 | Temp 99.0°F | Resp 17 | Ht 67.0 in | Wt 215.0 lb

## 2016-07-30 DIAGNOSIS — S93601A Unspecified sprain of right foot, initial encounter: Secondary | ICD-10-CM | POA: Diagnosis not present

## 2016-07-30 DIAGNOSIS — S93401A Sprain of unspecified ligament of right ankle, initial encounter: Secondary | ICD-10-CM

## 2016-07-30 DIAGNOSIS — M25571 Pain in right ankle and joints of right foot: Secondary | ICD-10-CM | POA: Diagnosis not present

## 2016-07-30 NOTE — Patient Instructions (Addendum)
Although the x-rays are negative, you likely still have a sprain of the ankle and possible midfoot sprain. Wear the walking boot until follow-up visit in 1 week. Out of the boot once or twice a day for range of motion is okay if tolerated. Ice on and off as listed below, Tylenol or Motrin over-the-counter as needed.    Ankle Sprain An ankle sprain is an injury to the strong, fibrous tissues (ligaments) that hold the bones of your ankle joint together.  CAUSES An ankle sprain is usually caused by a fall or by twisting your ankle. Ankle sprains most commonly occur when you step on the outer edge of your foot, and your ankle turns inward. People who participate in sports are more prone to these types of injuries.  SYMPTOMS   Pain in your ankle. The pain may be present at rest or only when you are trying to stand or walk.  Swelling.  Bruising. Bruising may develop immediately or within 1 to 2 days after your injury.  Difficulty standing or walking, particularly when turning corners or changing directions. DIAGNOSIS  Your caregiver will ask you details about your injury and perform a physical exam of your ankle to determine if you have an ankle sprain. During the physical exam, your caregiver will press on and apply pressure to specific areas of your foot and ankle. Your caregiver will try to move your ankle in certain ways. An X-ray exam may be done to be sure a bone was not broken or a ligament did not separate from one of the bones in your ankle (avulsion fracture).  TREATMENT  Certain types of braces can help stabilize your ankle. Your caregiver can make a recommendation for this. Your caregiver may recommend the use of medicine for pain. If your sprain is severe, your caregiver may refer you to a surgeon who helps to restore function to parts of your skeletal system (orthopedist) or a physical therapist. HOME CARE INSTRUCTIONS   Apply ice to your injury for 1-2 days or as directed by your  caregiver. Applying ice helps to reduce inflammation and pain.  Put ice in a plastic bag.  Place a towel between your skin and the bag.  Leave the ice on for 15-20 minutes at a time, every 2 hours while you are awake.  Only take over-the-counter or prescription medicines for pain, discomfort, or fever as directed by your caregiver.  Elevate your injured ankle above the level of your heart as much as possible for 2-3 days.  If your caregiver recommends crutches, use them as instructed. Gradually put weight on the affected ankle. Continue to use crutches or a cane until you can walk without feeling pain in your ankle.  If you have a plaster splint, wear the splint as directed by your caregiver. Do not rest it on anything harder than a pillow for the first 24 hours. Do not put weight on it. Do not get it wet. You may take it off to take a shower or bath.  You may have been given an elastic bandage to wear around your ankle to provide support. If the elastic bandage is too tight (you have numbness or tingling in your foot or your foot becomes cold and blue), adjust the bandage to make it comfortable.  If you have an air splint, you may blow more air into it or let air out to make it more comfortable. You may take your splint off at night and before taking a shower  or bath. Wiggle your toes in the splint several times per day to decrease swelling. SEEK MEDICAL CARE IF:   You have rapidly increasing bruising or swelling.  Your toes feel extremely cold or you lose feeling in your foot.  Your pain is not relieved with medicine. SEEK IMMEDIATE MEDICAL CARE IF:  Your toes are numb or blue.  You have severe pain that is increasing. MAKE SURE YOU:   Understand these instructions.  Will watch your condition.  Will get help right away if you are not doing well or get worse.   This information is not intended to replace advice given to you by your health care provider. Make sure you discuss  any questions you have with your health care provider.   Document Released: 10/15/2005 Document Revised: 11/05/2014 Document Reviewed: 10/27/2011 Elsevier Interactive Patient Education 2016 [employer]  RICE for Routine Care of Injuries Theroutine careofmanyinjuriesincludes rest, ice, compression, and elevation (RICE therapy). RICE therapy is often recommended for injuries to soft tissues, such as a muscle strain, ligament injuries, bruises, and overuse injuries. It can also be used for some bony injuries. Using RICE therapy can help to relieve pain, lessen swelling, and enable your body to heal. Rest Rest is required to allow your body to heal. This usually involves reducing your normal activities and avoiding use of the injured part of your body. Generally, you can return to your normal activities when you are comfortable and have been given permission by your health care provider. Ice Icing your injury helps to keep the swelling down, and it lessens pain. Do not apply ice directly to your skin.  Put ice in a plastic bag.  Place a towel between your skin and the bag.  Leave the ice on for 20 minutes, 2-3 times a day. Do this for as long as you are directed by your health care provider. Compression Compression means putting pressure on the injured area. Compression helps to keep swelling down, gives support, and helps with discomfort. Compression may be done with an elastic bandage. If an elastic bandage has been applied, follow these general tips:  Remove and reapply the bandage every 3-4 hours or as directed by your health care provider.  Make sure the bandage is not wrapped too tightly, because this can cut off circulation. If part of your body beyond the bandage becomes blue, numb, cold, swollen, or more painful, your bandage is most likely too tight. If this occurs, remove your bandage and reapply it more loosely.  See your health care provider if the bandage seems to be making  your problems worse rather than better. Elevation Elevation means keeping the injured area raised. This helps to lessen swelling and decrease pain. If possible, your injured area should be elevated at or above the level of your heart or the center of your chest. WHEN SHOULD I SEEK MEDICAL CARE? You should seek medical care if:  Your pain and swelling continue.  Your symptoms are getting worse rather than improving. These symptoms may indicate that further evaluation or further X-rays are needed. Sometimes, X-rays may not show a small broken bone (fracture) until a number of days later. Make a follow-up appointment with your health care provider. WHEN SHOULD I SEEK IMMEDIATE MEDICAL CARE? You should seek immediate medical care if:  You have sudden severe pain at or below the area of your injury.  You have redness or increased swelling around your injury.  You have tingling or numbness at or below the  area of your injury that does not improve after you remove the elastic bandage.   This information is not intended to replace advice given to you by your health care provider. Make sure you discuss any questions you have with your health care provider.   Document Released: 01/27/2001 Document Revised: 07/06/2015 Document Reviewed: 09/22/2014 Elsevier Interactive Patient Education 2016 [employer].      IF you received an x-ray today, you will receive an invoice from [facility]. Please contact [facility] at [phone] with questions or concerns regarding your invoice.   IF you received labwork today, you will receive an invoice from [employer]. Please contact [facility] at [phone] with questions or concerns regarding your invoice.   Our billing staff will not be able to assist you with questions regarding bills from these companies.  You will be contacted with the lab results as soon as they are available. The fastest way to get your  results is to activate your My Chart account. Instructions are located on the last page of this paperwork. If you have not heard from [place] regarding the results in 2 weeks, please contact this office.

## 2016-07-30 NOTE — Progress Notes (Signed)
By signing my name below, I, Mesha Guinyard, attest that this documentation has been prepared under the direction and in the presence of Meredith Staggers, MD.  Electronically Signed: Arvilla Market, Medical Scribe. 07/30/16. 3:41 PM.  Subjective:    Patient ID: Laura Gross, female    DOB: 1985/11/03, 30 y.o.   MRN: [id number]  HPI Chief Complaint  Patient presents with  . Ankle Injury    Fell down steps yesterday. Right.     HPI Comments: Floriene Jeschke is a 29 y.o. female who presents to the [facility] complaining of right ankle injury onset yesterday. Pt fell down the steps yesterday when the tip of her flip-flop got caught and her ankle rolled forward. Pt reports hearing a pop when she fell, having pain when she puts full weight on her ankle, and some tenderness above her ankle. Pt took Tylenol for relief to her symptoms. Pt is breast feeding a 16 m/o. Pt denies rt calf pain.  Patient Active Problem List   Diagnosis Date Noted  . Pregnancy 03/09/2015   Past Medical History:  Diagnosis Date  . Headache   . Hx of varicella    Past Surgical History:  Procedure Laterality Date  . APPENDECTOMY    . FEMUR SURGERY    . SPONTANEOUS HIP DISLOCATION REPAIR    . TONSILLECTOMY     Allergies  Allergen Reactions  . Penicillins Hives   Prior to Admission medications   Medication Sig Start Date End Date Taking? Authorizing Provider  acetaminophen (TYLENOL) 500 MG tablet Take 500-1,000 mg by mouth every 6 (six) hours as needed for mild pain or headache.    Historical Provider, MD  ciprofloxacin (CIPRO) 250 MG tablet Take 1 tablet (250 mg total) by mouth 2 (two) times daily. Patient not taking: Reported on 07/30/2016 03/19/15   Rodell Perna, NP   Social History   Social History  . Marital status: Married    Spouse name: N/A  . Number of children: N/A  . Years of education: N/A   Occupational History  . Not on file.   Social History Main  Topics  . Smoking status: Never Smoker  . Smokeless tobacco: Never Used  . Alcohol use No  . Drug use: No  . Sexual activity: Yes   Other Topics Concern  . Not on file   Social History Narrative  . No narrative on file   Depression screen [facility] 2/9 07/30/2016 10/01/2014  Decreased Interest 0 0  Down, Depressed, Hopeless 0 0  PHQ - 2 Score 0 0   Review of Systems  Musculoskeletal: Positive for arthralgias (ankle injury).  Skin: Positive for wound (abrasion).   Objective:  Physical Exam  Constitutional: She appears well-developed and well-nourished. No distress.  HENT:  Head: Normocephalic and atraumatic.  Eyes: Conjunctivae are normal.  Neck: Neck supple.  Cardiovascular: Normal rate.   Pulmonary/Chest: Effort normal.  Musculoskeletal:  Positive squeeze but no fibular head tenderness Superficial abrasion across dorsal ankle, great toe, and foot Tender along medial greater malleolus Soft tissue swelling anterior lateral ankle Slight soft tissue swelling dorsal foot Tender along the navicula And diffuse across the dorsum proximal foot as well as plantar foot 3rd toe tender at middle phalanx 2nd toe at the approx phalanx Great toe non tender  Neurological: She is alert.  Skin: Skin is warm and dry.  Psychiatric: She has a normal mood and affect. Her behavior is normal.  Nursing note and vitals reviewed.  BP 128/84 (BP Location: Right Arm, Patient Position: Sitting, Cuff Size: Large)   Pulse 71   Temp 99 F (37.2 C) (Oral)   Resp 17   Ht 5\' 7"  (1.702 m)   Wt 215 lb (97.5 kg)   LMP 07/09/2016   SpO2 99%   BMI 33.67 kg/m    Dg Ankle Complete Right  Result Date: 07/30/2016 CLINICAL DATA:  Status post fall down stairs yesterday with a right foot and ankle injury. Pain. Initial encounter. EXAM: RIGHT ANKLE - COMPLETE 3+ VIEW COMPARISON:  None. FINDINGS: There is no evidence of fracture, dislocation, or joint effusion. There is no evidence of arthropathy or other focal  bone abnormality. Soft tissues are unremarkable. IMPRESSION: Negative exam. Electronically Signed   By: Drusilla Kanner M.D.   On: 07/30/2016 16:21   Dg Foot Complete Right  Result Date: 07/30/2016 CLINICAL DATA:  Right foot and ankle pain since a fall down stairs yesterday. Initial encounter. EXAM: RIGHT FOOT COMPLETE - 3+ VIEW COMPARISON:  None. FINDINGS: No acute bony or joint abnormality is identified. Accessory ossicle of the navicular is noted. No evidence of arthropathy. Soft tissues are unremarkable. IMPRESSION: No acute abnormality. Electronically Signed   By: Drusilla Kanner M.D.   On: 07/30/2016 16:23   Assessment & Plan:   Jina Olenick is a 30 y.o. female Pain in joint involving right ankle and foot - Plan: DG Ankle Complete Right, DG Foot Complete Right  Acute right ankle pain - Plan: DG Ankle Complete Right, DG Foot Complete Right  Sprain of right ankle, unspecified ligament, initial encounter - Plan: Apply cam walker  Sprain of right foot, initial encounter - Plan: Apply cam walker  Right foot and ankle sprain, did have some midfoot pain and navicular pain, but no fracture seen. Placed in cam walker, RICE therapy discussed, over-the-counter anti-inflammatory, and RTC precautions. Recheck in 1 week. Sooner if worse.  No orders of the defined types were placed in this encounter.  Patient Instructions   Although the x-rays are negative, you likely still have a sprain of the ankle and possible midfoot sprain. Wear the walking boot until follow-up visit in 1 week. Out of the boot once or twice a day for range of motion is okay if tolerated. Ice on and off as listed below, Tylenol or Motrin over-the-counter as needed.    Ankle Sprain An ankle sprain is an injury to the strong, fibrous tissues (ligaments) that hold the bones of your ankle joint together.  CAUSES An ankle sprain is usually caused by a fall or by twisting your ankle. Ankle sprains most commonly occur  when you step on the outer edge of your foot, and your ankle turns inward. People who participate in sports are more prone to these types of injuries.  SYMPTOMS   Pain in your ankle. The pain may be present at rest or only when you are trying to stand or walk.  Swelling.  Bruising. Bruising may develop immediately or within 1 to 2 days after your injury.  Difficulty standing or walking, particularly when turning corners or changing directions. DIAGNOSIS  Your caregiver will ask you details about your injury and perform a physical exam of your ankle to determine if you have an ankle sprain. During the physical exam, your caregiver will press on and apply pressure to specific areas of your foot and ankle. Your caregiver will try to move your ankle in certain ways. An X-ray exam may be done to be sure a  bone was not broken or a ligament did not separate from one of the bones in your ankle (avulsion fracture).  TREATMENT  Certain types of braces can help stabilize your ankle. Your caregiver can make a recommendation for this. Your caregiver may recommend the use of medicine for pain. If your sprain is severe, your caregiver may refer you to a surgeon who helps to restore function to parts of your skeletal system (orthopedist) or a physical therapist. HOME CARE INSTRUCTIONS   Apply ice to your injury for 1-2 days or as directed by your caregiver. Applying ice helps to reduce inflammation and pain.  Put ice in a plastic bag.  Place a towel between your skin and the bag.  Leave the ice on for 15-20 minutes at a time, every 2 hours while you are awake.  Only take over-the-counter or prescription medicines for pain, discomfort, or fever as directed by your caregiver.  Elevate your injured ankle above the level of your heart as much as possible for 2-3 days.  If your caregiver recommends crutches, use them as instructed. Gradually put weight on the affected ankle. Continue to use crutches or a cane  until you can walk without feeling pain in your ankle.  If you have a plaster splint, wear the splint as directed by your caregiver. Do not rest it on anything harder than a pillow for the first 24 hours. Do not put weight on it. Do not get it wet. You may take it off to take a shower or bath.  You may have been given an elastic bandage to wear around your ankle to provide support. If the elastic bandage is too tight (you have numbness or tingling in your foot or your foot becomes cold and blue), adjust the bandage to make it comfortable.  If you have an air splint, you may blow more air into it or let air out to make it more comfortable. You may take your splint off at night and before taking a shower or bath. Wiggle your toes in the splint several times per day to decrease swelling. SEEK MEDICAL CARE IF:   You have rapidly increasing bruising or swelling.  Your toes feel extremely cold or you lose feeling in your foot.  Your pain is not relieved with medicine. SEEK IMMEDIATE MEDICAL CARE IF:  Your toes are numb or blue.  You have severe pain that is increasing. MAKE SURE YOU:   Understand these instructions.  Will watch your condition.  Will get help right away if you are not doing well or get worse.   This information is not intended to replace advice given to you by your health care provider. Make sure you discuss any questions you have with your health care provider.   Document Released: 10/15/2005 Document Revised: 11/05/2014 Document Reviewed: 10/27/2011 Elsevier Interactive Patient Education 2016 Elsevier Inc[employer]  RICE for Routine Care of Injuries Theroutine careofmanyinjuriesincludes rest, ice, compression, and elevation (RICE therapy). RICE therapy is often recommended for injuries to soft tissues, such as a muscle strain, ligament injuries, bruises, and overuse injuries. It can also be used for some bony injuries. Using RICE therapy can help to relieve pain, lessen  swelling, and enable your body to heal. Rest Rest is required to allow your body to heal. This usually involves reducing your normal activities and avoiding use of the injured part of your body. Generally, you can return to your normal activities when you are comfortable and have been given permission by your  health care provider. Ice Icing your injury helps to keep the swelling down, and it lessens pain. Do not apply ice directly to your skin.  Put ice in a plastic bag.  Place a towel between your skin and the bag.  Leave the ice on for 20 minutes, 2-3 times a day. Do this for as long as you are directed by your health care provider. Compression Compression means putting pressure on the injured area. Compression helps to keep swelling down, gives support, and helps with discomfort. Compression may be done with an elastic bandage. If an elastic bandage has been applied, follow these general tips:  Remove and reapply the bandage every 3-4 hours or as directed by your health care provider.  Make sure the bandage is not wrapped too tightly, because this can cut off circulation. If part of your body beyond the bandage becomes blue, numb, cold, swollen, or more painful, your bandage is most likely too tight. If this occurs, remove your bandage and reapply it more loosely.  See your health care provider if the bandage seems to be making your problems worse rather than better. Elevation Elevation means keeping the injured area raised. This helps to lessen swelling and decrease pain. If possible, your injured area should be elevated at or above the level of your heart or the center of your chest. WHEN SHOULD I SEEK MEDICAL CARE? You should seek medical care if:  Your pain and swelling continue.  Your symptoms are getting worse rather than improving. These symptoms may indicate that further evaluation or further X-rays are needed. Sometimes, X-rays may not show a small broken bone (fracture) until  a number of days later. Make a follow-up appointment with your health care provider. WHEN SHOULD I SEEK IMMEDIATE MEDICAL CARE? You should seek immediate medical care if:  You have sudden severe pain at or below the area of your injury.  You have redness or increased swelling around your injury.  You have tingling or numbness at or below the area of your injury that does not improve after you remove the elastic bandage.   This information is not intended to replace advice given to you by your health care provider. Make sure you discuss any questions you have with your health care provider.   Document Released: 01/27/2001 Document Revised: 07/06/2015 Document Reviewed: 09/22/2014 Elsevier Interactive Patient Education 2016 [employer].      IF you received an x-ray today, you will receive an invoice from [facility]. Please contact [facility] at [phone] with questions or concerns regarding your invoice.   IF you received labwork today, you will receive an invoice from [employer]. Please contact [facility] at [phone] with questions or concerns regarding your invoice.   Our billing staff will not be able to assist you with questions regarding bills from these companies.  You will be contacted with the lab results as soon as they are available. The fastest way to get your results is to activate your My Chart account. Instructions are located on the last page of this paperwork. If you have not heard from [place] regarding the results in 2 weeks, please contact this office.        I personally performed the services described in this documentation, which was scribed in my presence. The recorded information has been reviewed and considered, and addended by me as needed.   Signed,   Meredith Staggers, MD [facility].  08/01/16 12:47  PM   

## 2016-08-13 ENCOUNTER — Ambulatory Visit: Payer: Managed Care, Other (non HMO) | Admitting: Primary Care

## 2016-08-14 ENCOUNTER — Encounter: Payer: Self-pay | Admitting: Primary Care

## 2016-08-14 ENCOUNTER — Ambulatory Visit (INDEPENDENT_AMBULATORY_CARE_PROVIDER_SITE_OTHER): Payer: BLUE CROSS/BLUE SHIELD | Admitting: Primary Care

## 2016-08-14 VITALS — BP 118/74 | HR 70 | Temp 98.6°F | Ht 66.0 in | Wt 216.8 lb

## 2016-08-14 DIAGNOSIS — Z6834 Body mass index (BMI) 34.0-34.9, adult: Secondary | ICD-10-CM | POA: Diagnosis not present

## 2016-08-14 DIAGNOSIS — E669 Obesity, unspecified: Secondary | ICD-10-CM | POA: Insufficient documentation

## 2016-08-14 DIAGNOSIS — Z0000 Encounter for general adult medical examination without abnormal findings: Secondary | ICD-10-CM | POA: Diagnosis not present

## 2016-08-14 DIAGNOSIS — G43901 Migraine, unspecified, not intractable, with status migrainosus: Secondary | ICD-10-CM | POA: Diagnosis not present

## 2016-08-14 DIAGNOSIS — Z0001 Encounter for general adult medical examination with abnormal findings: Secondary | ICD-10-CM | POA: Insufficient documentation

## 2016-08-14 HISTORY — DX: Obesity, unspecified: E66.9

## 2016-08-14 MED ORDER — SUMATRIPTAN SUCCINATE 50 MG PO TABS: ORAL_TABLET | ORAL | 0 refills | Status: DC

## 2016-08-14 NOTE — Assessment & Plan Note (Signed)
Occur once monthly around menstrual cycle. No prior preventative for abortive treatment. Options for treatment discussed, prescription for Imitrex 50 mg sent to pharmacy. Discussed indications for use and that she cannot breast-feed while taking this medication.

## 2016-08-14 NOTE — Progress Notes (Signed)
Pre visit review using our clinic review tool, if applicable. No additional management support is needed unless otherwise documented below in the visit note. 

## 2016-08-14 NOTE — Assessment & Plan Note (Addendum)
Difficulty losing weight most of her life. Currently managed on a ketogenic diet. She has been without success with calorie counting and using App's such as my fitness pal. Discussed that she must start exercising. Also discussed increased consumption of vegetables, fruit, whole gr. Will check TSH and A1c today to rule out any metabolic cause.

## 2016-08-14 NOTE — Patient Instructions (Signed)
You may take Imitrex (Sumatriptan) 50 mg tablets for severe migraines. Take 1 tablet at migraine onset. May repeat in 2 hours if migraine persists. Do not exceed 2 tablets in 24 hours.  Start exercising. You should be getting 150 minutes of moderate intensity exercise weekly.  Ensure you are consuming 64 ounces of water daily.  Consider calorie counting. Download the App "My Fitness Pal" for calorie and weight loss tracking.   Schedule a lab only appointment in the near future at your convenience. Ensure you come fasting 4 hours prior to this appointment.  It was a pleasure to meet you today! Please don't hesitate to call me with any questions. Welcome to [facility]!

## 2016-08-14 NOTE — Assessment & Plan Note (Signed)
Immunizations up-to-date. Declines influenza vaccination. Overall eats a healthy diet, but does not routinely exercise. Strongly encouraged 150 minutes of moderate intensity exercise weekly. Exam unremarkable. Labs pending. Follow-up in one year for repeat physical.

## 2016-08-14 NOTE — Progress Notes (Signed)
Subjective:    Patient ID: Laura Gross, female    DOB: February 12, 1986, 30 y.o.   MRN: [id number]  HPI  [name] is a 30 year old female who presents today to establish care, discuss the problems mentioned below, and for complete physical. Will obtain old records. Her last physical was several years ago.   1) Migraines: Present since puberty and occur monthly around her menstrual cycles. She will experience migraines to her left frontal and parietal lobe mostly, sometimes to the right side. She will experience nausea, vomiting, photophobia during migraines. She currently takes ibuprofen and will sleep with improvement most of the time. She's never had any preventative or abortive treatment.  Immunizations: -Tetanus: Completed within 10 years ago.  -Influenza: Declines   Diet: She endorses a healthy diet. Breakfast: Scrambled egg, protein shakes Lunch: Salads with protein Dinner: Pasta, meat, vegetables Snacks: None Desserts: Occasionally  Beverages: Sparkling water, water, milk  Exercise: She does not currently exercise Eye exam: Completed in 2016, no acute changes in vision Dental exam: Completes annually Pap Smear: Completed in 2015, normal. She does not follow with GYN.    Review of Systems  Constitutional: Negative for unexpected weight change.  HENT: Negative for rhinorrhea.   Respiratory: Negative for cough and shortness of breath.   Cardiovascular: Negative for chest pain.  Gastrointestinal: Negative for constipation and diarrhea.  Genitourinary: Negative for difficulty urinating and menstrual problem.  Musculoskeletal: Negative for arthralgias and myalgias.  Skin: Negative for rash.  Allergic/Immunologic: Negative for environmental allergies.  Neurological: Positive for headaches. Negative for dizziness and numbness.  Psychiatric/Behavioral:       Denies concerns for depression. Is anxious daily, feels that she's able to manage on her own.         Past Medical History:  Diagnosis Date  . Chicken pox   . Headache   . History of UTI   . Hx of varicella   . Migraine      Social History   Social History  . Marital status: Married    Spouse name: N/A  . Number of children: N/A  . Years of education: N/A   Occupational History  . Not on file.   Social History Main Topics  . Smoking status: Never Smoker  . Smokeless tobacco: Never Used  . Alcohol use No  . Drug use: No  . Sexual activity: Yes   Other Topics Concern  . Not on file   Social History Narrative  . No narrative on file    Past Surgical History:  Procedure Laterality Date  . APPENDECTOMY    . FEMUR SURGERY    . SPONTANEOUS HIP DISLOCATION REPAIR    . TONSILLECTOMY      Family History  Problem Relation Age of Onset  . Diabetes Paternal Grandmother   . Cancer Paternal Grandmother     lung  . Hyperlipidemia Mother   . Arthritis Mother   . Cancer Maternal Grandmother     breast  . Cancer Maternal Grandfather     nelanoma    Allergies  Allergen Reactions  . Penicillins Hives    No current outpatient prescriptions on file prior to visit.   No current facility-administered medications on file prior to visit.     BP 118/74   Pulse 70   Temp 98.6 F (37 C) (Oral)   Ht 5\' 6"  (1.676 m)   Wt 216 lb 12.8 oz (98.3 kg)   LMP 08/13/2016   SpO2 99%  BMI 34.99 kg/m    Objective:   Physical Exam  Constitutional: She is oriented to person, place, and time. She appears well-nourished.  HENT:  Right Ear: Tympanic membrane and ear canal normal.  Left Ear: Tympanic membrane and ear canal normal.  Nose: Nose normal.  Mouth/Throat: Oropharynx is clear and moist.  Eyes: Conjunctivae and EOM are normal. Pupils are equal, round, and reactive to light.  Neck: Neck supple. No thyromegaly present.  Cardiovascular: Normal rate and regular rhythm.   No murmur heard. Pulmonary/Chest: Effort normal and breath sounds normal. She has no rales.   Abdominal: Soft. Bowel sounds are normal. There is no tenderness.  Musculoskeletal: Normal range of motion.  Lymphadenopathy:    She has no cervical adenopathy.  Neurological: She is alert and oriented to person, place, and time. She has normal reflexes. No cranial nerve deficit.  Skin: Skin is warm and dry. No rash noted.  Psychiatric: She has a normal mood and affect.          Assessment & Plan:

## 2016-08-15 LAB — LIPID PANEL
Cholesterol: 157 mg/dL (ref 0–200)
HDL: 50.4 mg/dL (ref >39.00)
NONHDL: 106.87
TRIGLYCERIDES: 202 mg/dL — AB (ref 0.0–149.0)
Total CHOL/HDL Ratio: 3
VLDL: 40.4 mg/dL — ABNORMAL HIGH (ref 0.0–40.0)

## 2016-08-15 LAB — COMPREHENSIVE METABOLIC PANEL
ALT: 19 U/L (ref 0–35)
AST: 31 U/L (ref 0–37)
Albumin: 4.6 g/dL (ref 3.5–5.2)
Alkaline Phosphatase: 82 U/L (ref 39–117)
BILIRUBIN TOTAL: 0.4 mg/dL (ref 0.2–1.2)
BUN: 7 mg/dL (ref 6–23)
CALCIUM: 9.7 mg/dL (ref 8.4–10.5)
CHLORIDE: 104 meq/L (ref 96–112)
CO2: 28 meq/L (ref 19–32)
CREATININE: 0.69 mg/dL (ref 0.40–1.20)
GFR: 106.9 mL/min (ref >60.00)
Glucose, Bld: 74 mg/dL (ref 70–99)
Potassium: 4.1 mEq/L (ref 3.5–5.1)
SODIUM: 139 meq/L (ref 135–145)
Total Protein: 7.7 g/dL (ref 6.0–8.3)

## 2016-08-15 LAB — LDL CHOLESTEROL, DIRECT: LDL DIRECT: 86 mg/dL

## 2016-08-15 LAB — HEMOGLOBIN A1C: Hgb A1c MFr Bld: 4.9 % (ref 4.6–6.5)

## 2016-08-15 LAB — TSH: TSH: 1.24 u[IU]/mL (ref 0.35–4.50)

## 2016-11-27 ENCOUNTER — Encounter: Payer: Self-pay | Admitting: Primary Care

## 2017-07-15 ENCOUNTER — Encounter: Payer: Self-pay | Admitting: Primary Care

## 2017-07-19 ENCOUNTER — Encounter: Payer: Self-pay | Admitting: Primary Care

## 2017-07-19 ENCOUNTER — Ambulatory Visit (INDEPENDENT_AMBULATORY_CARE_PROVIDER_SITE_OTHER): Payer: BLUE CROSS/BLUE SHIELD | Admitting: Primary Care

## 2017-07-19 VITALS — BP 116/76 | HR 66 | Temp 98.3°F | Ht 66.0 in | Wt 198.4 lb

## 2017-07-19 DIAGNOSIS — Z319 Encounter for procreative management, unspecified: Secondary | ICD-10-CM

## 2017-07-19 HISTORY — DX: Encounter for procreative management, unspecified: Z31.9

## 2017-07-19 LAB — LIPID PANEL
CHOLESTEROL: 151 mg/dL (ref 0–200)
HDL: 49.8 mg/dL (ref >39.00)
LDL Cholesterol: 79 mg/dL (ref 0–99)
NONHDL: 101.01
Total CHOL/HDL Ratio: 3
Triglycerides: 112 mg/dL (ref 0.0–149.0)
VLDL: 22.4 mg/dL (ref 0.0–40.0)

## 2017-07-19 LAB — CBC
HCT: 39.6 % (ref 36.0–46.0)
HEMOGLOBIN: 13.3 g/dL (ref 12.0–15.0)
MCHC: 33.6 g/dL (ref 30.0–36.0)
MCV: 89 fl (ref 78.0–100.0)
PLATELETS: 217 10*3/uL (ref 150.0–400.0)
RBC: 4.45 Mil/uL (ref 3.87–5.11)
RDW: 12.8 % (ref 11.5–15.5)
WBC: 6.2 10*3/uL (ref 4.0–10.5)

## 2017-07-19 LAB — HEMOGLOBIN A1C: HEMOGLOBIN A1C: 4.9 % (ref 4.6–6.5)

## 2017-07-19 LAB — COMPREHENSIVE METABOLIC PANEL
ALT: 16 U/L (ref 0–35)
AST: 28 U/L (ref 0–37)
Albumin: 4.3 g/dL (ref 3.5–5.2)
Alkaline Phosphatase: 60 U/L (ref 39–117)
BUN: 10 mg/dL (ref 6–23)
CALCIUM: 9.3 mg/dL (ref 8.4–10.5)
CHLORIDE: 107 meq/L (ref 96–112)
CO2: 27 mEq/L (ref 19–32)
Creatinine, Ser: 0.67 mg/dL (ref 0.40–1.20)
GFR: 109.89 mL/min (ref >60.00)
Glucose, Bld: 96 mg/dL (ref 70–99)
Potassium: 4.1 mEq/L (ref 3.5–5.1)
Sodium: 139 mEq/L (ref 135–145)
TOTAL PROTEIN: 7 g/dL (ref 6.0–8.3)
Total Bilirubin: 0.6 mg/dL (ref 0.2–1.2)

## 2017-07-19 LAB — TSH: TSH: 2.03 u[IU]/mL (ref 0.35–4.50)

## 2017-07-19 NOTE — Assessment & Plan Note (Signed)
No conception after 8 months of attempts. Doesn't seem to be any obvious reason for infertility. Check labs today including TSH, CBC, CMP, A1C, Lipids. Discussed to start looking for a GYN and that infertility is considered after one year without successful conception.

## 2017-07-19 NOTE — Patient Instructions (Signed)
Complete lab work prior to leaving today. I will notify you of your results once received.   It was a pleasure to see you today!  

## 2017-07-19 NOTE — Progress Notes (Signed)
   Subjective:    Patient ID: Laura Gross, female    DOB: May 03, 1986, 31 y.o.   MRN: [id number]  HPI  Laura Gross is a 31 year old female who presents today to discuss infertility. She and her husband have been actively trying to get pregnant for the past 8 months and have been unsuccessful. She had no problems conceiving her first child, wasn't trying to conceive or prevent conception. She had a normal pregnancy and delivery with her first child. Endorses normal, regular menstrual cycles, no missed cycles. She is using a fertility calendar to tract ovulation. She's not used birth control for 10 years. She denies change in diet, medication (including OTC), weight, sleep patters. She denies anxiety and all vaginal symptoms.   Review of Systems  Constitutional: Negative for unexpected weight change.  Respiratory: Negative for shortness of breath.   Cardiovascular: Negative for chest pain.  Gastrointestinal: Negative for abdominal pain.  Genitourinary: Negative for dysuria, menstrual problem, pelvic pain, vaginal bleeding and vaginal discharge.  Psychiatric/Behavioral: The patient is not nervous/anxious.        Past Medical History:  Diagnosis Date  . Chicken pox   . Headache   . History of UTI   . Hx of varicella   . Migraine      Social History   Social History  . Marital status: Married    Spouse name: N/A  . Number of children: N/A  . Years of education: N/A   Occupational History  . Not on file.   Social History Main Topics  . Smoking status: Never Smoker  . Smokeless tobacco: Never Used  . Alcohol use No  . Drug use: No  . Sexual activity: Yes   Other Topics Concern  . Not on file   Social History Narrative  . No narrative on file    Past Surgical History:  Procedure Laterality Date  . APPENDECTOMY    . FEMUR SURGERY    . SPONTANEOUS HIP DISLOCATION REPAIR    . TONSILLECTOMY      Family History  Problem Relation Age of Onset  .  Diabetes Paternal Grandmother   . Cancer Paternal Grandmother        lung  . Hyperlipidemia Mother   . Arthritis Mother   . Cancer Maternal Grandmother        breast  . Cancer Maternal Grandfather        nelanoma    Allergies  Allergen Reactions  . Penicillins Hives    No current outpatient prescriptions on file prior to visit.   No current facility-administered medications on file prior to visit.     BP 116/76   Pulse 66   Temp 98.3 F (36.8 C) (Oral)   Ht  (1.676 m)   Wt 198 lb 6.4 oz (90 kg)   LMP 07/18/2017   SpO2 99%   BMI 32.02 kg/m    Objective:   Physical Exam  Constitutional: She appears well-nourished.  Neck: Neck supple.  Cardiovascular: Normal rate and regular rhythm.   Pulmonary/Chest: Effort normal and breath sounds normal.  Abdominal: Soft. Bowel sounds are normal. There is no tenderness.  Skin: Skin is warm and dry.  Psychiatric: She has a normal mood and affect.          Assessment & Plan:

## 2017-12-18 ENCOUNTER — Ambulatory Visit: Payer: BLUE CROSS/BLUE SHIELD | Admitting: Primary Care

## 2017-12-18 VITALS — BP 116/74 | HR 67 | Temp 98.2°F | Ht 66.0 in | Wt 202.0 lb

## 2017-12-18 DIAGNOSIS — Z0000 Encounter for general adult medical examination without abnormal findings: Secondary | ICD-10-CM

## 2017-12-18 DIAGNOSIS — F411 Generalized anxiety disorder: Secondary | ICD-10-CM | POA: Diagnosis not present

## 2017-12-18 NOTE — Progress Notes (Signed)
Subjective:    Patient ID: Laura Gross, female    DOB: 06/13/1986, 32 y.o.   MRN: [id number]  HPI  Laura Gross is a 32 year old female who presents today for complete physical for form completion and also with a chief complaint of anxiety.  Symptoms of feeling anxious, thinking "worst case scenario", feeling irritable, afraid to leave her daughter. Symptoms ave been present for the past 3 years. GAD 7 score of 19 today. She's never been treated for anxiety in the past.    Immunizations: -Tetanus: Declines, last one was in 2001 -Influenza: Decclines   Diet: She endorses a healthy diet. Breakfast: Protein shake Lunch: Salad, whole grains, fruit, protein Dinner: Chicken, salad, tacos  Snacks: None Desserts: None Beverages: Water, coffee, occasional alcohol  Exercise: She walks on the weekends Eye exam: Completed in August 2018 Dental exam: Completes semi-annually  Pap Smear: Completed in 2016, following with gynecologist    Review of Systems  Constitutional: Negative for unexpected weight change.  HENT: Negative for rhinorrhea.   Respiratory: Negative for cough and shortness of breath.   Cardiovascular: Negative for chest pain.  Gastrointestinal: Negative for constipation and diarrhea.  Genitourinary: Negative for difficulty urinating and menstrual problem.  Musculoskeletal: Negative for arthralgias and myalgias.  Skin: Negative for rash.  Allergic/Immunologic: Negative for environmental allergies.  Neurological: Negative for dizziness, numbness and headaches.  Psychiatric/Behavioral:       Concerns about anxiety       Past Medical History:  Diagnosis Date  . Chicken pox   . Headache   . History of UTI   . Hx of varicella   . Migraine      Social History   Socioeconomic History  . Marital status: Married    Spouse name: Not on file  . Number of children: Not on file  . Years of education: Not on file  . Highest education level: Not on  file  Social Needs  . Financial resource strain: Not on file  . Food insecurity - worry: Not on file  . Food insecurity - inability: Not on file  . Transportation needs - medical: Not on file  . Transportation needs - non-medical: Not on file  Occupational History  . Not on file  Tobacco Use  . Smoking status: Never Smoker  . Smokeless tobacco: Never Used  Substance and Sexual Activity  . Alcohol use: No  . Drug use: No  . Sexual activity: Yes  Other Topics Concern  . Not on file  Social History Narrative  . Not on file    Past Surgical History:  Procedure Laterality Date  . APPENDECTOMY    . FEMUR SURGERY    . SPONTANEOUS HIP DISLOCATION REPAIR    . TONSILLECTOMY      Family History  Problem Relation Age of Onset  . Diabetes Paternal Grandmother   . Cancer Paternal Grandmother        lung  . Hyperlipidemia Mother   . Arthritis Mother   . Cancer Maternal Grandmother        breast  . Cancer Maternal Grandfather        nelanoma    Allergies  Allergen Reactions  . Penicillins Hives    No current outpatient medications on file prior to visit.   No current facility-administered medications on file prior to visit.     BP 116/74   Pulse 67   Temp 98.2 F (36.8 C) (Oral)   Ht 5\' 6"  (1.676 m)  Wt 202 lb (91.6 kg)   LMP 12/11/2017   SpO2 99%   BMI 32.60 kg/m    Objective:   Physical Exam  Constitutional: She is oriented to person, place, and time. She appears well-nourished.  HENT:  Right Ear: Tympanic membrane and ear canal normal.  Left Ear: Tympanic membrane and ear canal normal.  Nose: Nose normal.  Mouth/Throat: Oropharynx is clear and moist.  Eyes: Conjunctivae and EOM are normal. Pupils are equal, round, and reactive to light.  Neck: Neck supple. No thyromegaly present.  Cardiovascular: Normal rate and regular rhythm.  No murmur heard. Pulmonary/Chest: Effort normal and breath sounds normal. She has no rales.  Abdominal: Soft. Bowel sounds  are normal. There is no tenderness.  Musculoskeletal: Normal range of motion.  Lymphadenopathy:    She has no cervical adenopathy.  Neurological: She is alert and oriented to person, place, and time. She has normal reflexes. No cranial nerve deficit.  Skin: Skin is warm and dry. No rash noted.  Psychiatric: She has a normal mood and affect.          Assessment & Plan:

## 2017-12-18 NOTE — Assessment & Plan Note (Signed)
Td and influenza due, she declines today.  Pap smear due, she follows with gynecology.  Discussed the importance of a healthy diet and regular exercise in order for weight loss, and to reduce the risk of any potential medical problems. Exam unremarkable. Labs UTD. Follow up in 1 year. Form completed.

## 2017-12-18 NOTE — Patient Instructions (Addendum)
Start exercising. You should be getting 150 minutes of moderate intensity exercise weekly.  Increase vegetables, fruit, whole grains, lean protein.  Ensure you are consuming 64 ounces of water daily.  Follow up with your gynecologist for your pap smear.  You will be contacted regarding your referral to therapy.  Please let us know if you have not been contacted within one week.   Follow up in 1 year for your annual exam or sooner if needed.  It was a pleasure to see you today!   Preventive Care 18-39 Years, Female Preventive care refers to lifestyle choices and visits with your health care provider that can promote health and wellness. What does preventive care include?  A yearly physical exam. This is also called an annual well check.  Dental exams once or twice a year.  Routine eye exams. Ask your health care provider how often you should have your eyes checked.  Personal lifestyle choices, including: ? Daily care of your teeth and gums. ? Regular physical activity. ? Eating a healthy diet. ? Avoiding tobacco and drug use. ? Limiting alcohol use. ? Practicing safe sex. ? Taking vitamin and mineral supplements as recommended by your health care provider. What happens during an annual well check? The services and screenings done by your health care provider during your annual well check will depend on your age, overall health, lifestyle risk factors, and family history of disease. Counseling Your health care provider may ask you questions about your:  Alcohol use.  Tobacco use.  Drug use.  Emotional well-being.  Home and relationship well-being.  Sexual activity.  Eating habits.  Work and work Statistician.  Method of birth control.  Menstrual cycle.  Pregnancy history.  Screening You may have the following tests or measurements:  Height, weight, and BMI.  Diabetes screening. This is done by checking your blood sugar (glucose) after you have not eaten  for a while (fasting).  Blood pressure.  Lipid and cholesterol levels. These may be checked every 5 years starting at age 32.  Skin check.  Hepatitis C blood test.  Hepatitis B blood test.  Sexually transmitted disease (STD) testing.  BRCA-related cancer screening. This may be done if you have a family history of breast, ovarian, tubal, or peritoneal cancers.  Pelvic exam and Pap test. This may be done every 3 years starting at age 36. Starting at age 48, this may be done every 5 years if you have a Pap test in combination with an HPV test.  Discuss your test results, treatment options, and if necessary, the need for more tests with your health care provider. Vaccines Your health care provider may recommend certain vaccines, such as:  Influenza vaccine. This is recommended every year.  Tetanus, diphtheria, and acellular pertussis (Tdap, Td) vaccine. You may need a Td booster every 10 years.  Varicella vaccine. You may need this if you have not been vaccinated.  HPV vaccine. If you are 28 or younger, you may need three doses over 6 months.  Measles, mumps, and rubella (MMR) vaccine. You may need at least one dose of MMR. You may also need a second dose.  Pneumococcal 13-valent conjugate (PCV13) vaccine. You may need this if you have certain conditions and were not previously vaccinated.  Pneumococcal polysaccharide (PPSV23) vaccine. You may need one or two doses if you smoke cigarettes or if you have certain conditions.  Meningococcal vaccine. One dose is recommended if you are age 29-21 years and a Market researcher  living in a residence hall, or if you have one of several medical conditions. You may also need additional booster doses.  Hepatitis A vaccine. You may need this if you have certain conditions or if you travel or work in places where you may be exposed to hepatitis A.  Hepatitis B vaccine. You may need this if you have certain conditions or if you travel  or work in places where you may be exposed to hepatitis B.  Haemophilus influenzae type b (Hib) vaccine. You may need this if you have certain risk factors.  Talk to your health care provider about which screenings and vaccines you need and how often you need them. This information is not intended to replace advice given to you by your health care provider. Make sure you discuss any questions you have with your health care provider. Document Released: 12/11/2001 Document Revised: 07/04/2016 Document Reviewed: 08/16/2015 Elsevier Interactive Patient Education  [employer].

## 2017-12-18 NOTE — Assessment & Plan Note (Signed)
Ongoing symptoms for three years, GAD 7 score of 19 today. Long discussion regarding treatment options including therapy and medication, she opts for therapy.  Referral placed. Also discussed importance of regular exercise and a healthy diet.

## 2017-12-24 ENCOUNTER — Encounter: Payer: Self-pay | Admitting: Primary Care

## 2017-12-24 ENCOUNTER — Other Ambulatory Visit: Payer: Self-pay | Admitting: Primary Care

## 2017-12-24 DIAGNOSIS — F411 Generalized anxiety disorder: Secondary | ICD-10-CM

## 2017-12-24 MED ORDER — SERTRALINE HCL 25 MG PO TABS: 25.0000 mg | ORAL_TABLET | Freq: Every day | ORAL | 1 refills | Status: DC

## 2018-03-14 ENCOUNTER — Encounter: Payer: Self-pay | Admitting: Primary Care

## 2018-03-15 ENCOUNTER — Ambulatory Visit: Payer: BLUE CROSS/BLUE SHIELD | Admitting: Family Medicine

## 2018-03-15 ENCOUNTER — Other Ambulatory Visit: Payer: Self-pay

## 2018-03-15 VITALS — BP 100/70 | HR 75 | Temp 98.3°F | Resp 14 | Ht 66.0 in | Wt 211.0 lb

## 2018-03-15 DIAGNOSIS — R197 Diarrhea, unspecified: Secondary | ICD-10-CM

## 2018-03-15 NOTE — Progress Notes (Signed)
  Subjective:     Patient ID: Laura Gross, female   DOB: 1985-11-01, 32 y.o.   MRN: [id number]  HPI Patient seen in Saturday clinic with 5 day history of some nonbloody diarrhea. Started Monday. She thinks she had some low-grade fever and body aches initially but none since then. She had some mild nausea early in the week but no vomiting. She states she had up to 10 nonbloody stools per day. No recent travels. No recent antibiotics. No sick contacts. No persistent fever.Keeping down fluids.   Only mild abdominal cramps.  She's taken some Imodium. Her main concern is that they are planning to go out of town camping today.  Past Medical History:  Diagnosis Date  . Chicken pox   . Headache   . History of UTI   . Hx of varicella   . Migraine    Past Surgical History:  Procedure Laterality Date  . APPENDECTOMY    . FEMUR SURGERY    . SPONTANEOUS HIP DISLOCATION REPAIR    . TONSILLECTOMY      reports that she has never smoked. She has never used smokeless tobacco. She reports that she does not drink alcohol or use drugs. family history includes Arthritis in her mother; Cancer in her maternal grandfather, maternal grandmother, and paternal grandmother; Diabetes in her paternal grandmother; Hyperlipidemia in her mother. Allergies  Allergen Reactions  . Penicillins Hives     Review of Systems  Constitutional: Negative for chills and fever.  Respiratory: Negative for shortness of breath.   Gastrointestinal: Positive for diarrhea. Negative for anal bleeding, nausea and vomiting.       Objective:   Physical Exam  Constitutional: She appears well-developed and well-nourished.  Cardiovascular: Normal rate and regular rhythm.  Abdominal: Soft. Bowel sounds are normal. She exhibits no distension. There is no tenderness. There is no rebound and no guarding.       Assessment:     Five-day history of diarrhea. Suspect viral. She does not have any red flags such as fever, bloody  stools, recent travels, antibiotic use, etc. Does not appear dehydrated    Plan:     -continue Imodium  -Handout given on appropriate diet -Recommend touch base with primary next week if diarrhea not slowing down.  Kristian Covey MD [facility]

## 2018-03-15 NOTE — Patient Instructions (Signed)
Food Choices to Help Relieve Diarrhea, Adult  When you have diarrhea, the foods you eat and your eating habits are very important. Choosing the right foods and drinks can help:  · Relieve diarrhea.  · Replace lost fluids and nutrients.  · Prevent dehydration.    What general guidelines should I follow?  Relieving diarrhea  · Choose foods with less than 2 g or .07 oz. of fiber per serving.  · Limit fats to less than 8 tsp (38 g or 1.34 oz.) a day.  · Avoid the following:  ? Foods and beverages sweetened with high-fructose corn syrup, honey, or sugar alcohols such as xylitol, sorbitol, and mannitol.  ? Foods that contain a lot of fat or sugar.  ? Fried, greasy, or spicy foods.  ? High-fiber grains, breads, and cereals.  ? Raw fruits and vegetables.  · Eat foods that are rich in probiotics. These foods include dairy products such as yogurt and fermented milk products. They help increase healthy bacteria in the stomach and intestines (gastrointestinal tract, or GI tract).  · If you have lactose intolerance, avoid dairy products. These may make your diarrhea worse.  · Take medicine to help stop diarrhea (antidiarrheal medicine) only as told by your health care provider.  Replacing nutrients  · Eat small meals or snacks every 3–4 hours.  · Eat bland foods, such as white rice, toast, or baked potato, until your diarrhea starts to get better. Gradually reintroduce nutrient-rich foods as tolerated or as told by your health care provider. This includes:  ? Well-cooked protein foods.  ? Peeled, seeded, and soft-cooked fruits and vegetables.  ? Low-fat dairy products.  · Take vitamin and mineral supplements as told by your health care provider.  Preventing dehydration    · Start by sipping water or a special solution to prevent dehydration (oral rehydration solution, ORS). Urine that is clear or pale yellow means that you are getting enough fluid.  · Try to drink at least 8–10 cups of fluid each day to help replace lost  fluids.  · You may add other liquids in addition to water, such as clear juice or decaffeinated sports drinks, as tolerated or as told by your health care provider.  · Avoid drinks with caffeine, such as coffee, tea, or soft drinks.  · Avoid alcohol.  What foods are recommended?  The items listed may not be a complete list. Talk with your health care provider about what dietary choices are best for you.  Grains  White rice. White, French, or pita breads (fresh or toasted), including plain rolls, buns, or bagels. White pasta. Saltine, soda, or graham crackers. Pretzels. Low-fiber cereal. Cooked cereals made with water (such as cornmeal, farina, or cream cereals). Plain muffins. Matzo. Melba toast. Zwieback.  Vegetables  Potatoes (without the skin). Most well-cooked and canned vegetables without skins or seeds. Tender lettuce.  Fruits  Apple sauce. Fruits canned in juice. Cooked apricots, cherries, grapefruit, peaches, pears, or plums. Fresh bananas and cantaloupe.  Meats and other protein foods  Baked or boiled chicken. Eggs. Tofu. Fish. Seafood. Smooth nut butters. Ground or well-cooked tender beef, ham, veal, lamb, pork, or poultry.  Dairy  Plain yogurt, kefir, and unsweetened liquid yogurt. Lactose-free milk, buttermilk, skim milk, or soy milk. Low-fat or nonfat hard cheese.  Beverages  Water. Low-calorie sports drinks. Fruit juices without pulp. Strained tomato and vegetable juices. Decaffeinated teas. Sugar-free beverages not sweetened with sugar alcohols. Oral rehydration solutions, if approved by your health care   provider.  Seasoning and other foods  Bouillon, broth, or soups made from recommended foods.  What foods are not recommended?  The items listed may not be a complete list. Talk with your health care provider about what dietary choices are best for you.  Grains  Whole grain, whole wheat, bran, or rye breads, rolls, pastas, and crackers. Wild or brown rice. Whole grain or bran cereals. Barley. Oats  and oatmeal. Corn tortillas or taco shells. Granola. Popcorn.  Vegetables  Raw vegetables. Fried vegetables. Cabbage, broccoli, Brussels sprouts, artichokes, baked beans, beet greens, corn, kale, legumes, peas, sweet potatoes, and yams. Potato skins. Cooked spinach and cabbage.  Fruits  Dried fruit, including raisins and dates. Raw fruits. Stewed or dried prunes. Canned fruits with syrup.  Meat and other protein foods  Fried or fatty meats. Deli meats. Chunky nut butters. Nuts and seeds. Beans and lentils. Bacon. Hot dogs. Sausage.  Dairy  High-fat cheeses. Whole milk, chocolate milk, and beverages made with milk, such as milk shakes. Half-and-half. Cream. sour cream. Ice cream.  Beverages  Caffeinated beverages (such as coffee, tea, soda, or energy drinks). Alcoholic beverages. Fruit juices with pulp. Prune juice. Soft drinks sweetened with high-fructose corn syrup or sugar alcohols. High-calorie sports drinks.  Fats and oils  Butter. Cream sauces. Margarine. Salad oils. Plain salad dressings. Olives. Avocados. Mayonnaise.  Sweets and desserts  Sweet rolls, doughnuts, and sweet breads. Sugar-free desserts sweetened with sugar alcohols such as xylitol and sorbitol.  Seasoning and other foods  Honey. Hot sauce. Chili powder. Gravy. Cream-based or milk-based soups. Pancakes and waffles.  Summary  · When you have diarrhea, the foods you eat and your eating habits are very important.  · Make sure you get at least 8–10 cups of fluid each day, or enough to keep your urine clear or pale yellow.  · Eat bland foods and gradually reintroduce healthy, nutrient-rich foods as tolerated, or as told by your health care provider.  · Avoid high-fiber, fried, greasy, or spicy foods.  This information is not intended to replace advice given to you by your health care provider. Make sure you discuss any questions you have with your health care provider.  Document Released: 01/05/2004 Document Revised: 10/12/2016 Document  Reviewed: 10/12/2016  Elsevier Interactive Patient Education © 2018 Elsevier Inc.

## 2018-09-09 ENCOUNTER — Encounter (HOSPITAL_COMMUNITY): Payer: Self-pay

## 2018-09-09 ENCOUNTER — Emergency Department (HOSPITAL_COMMUNITY): Payer: BLUE CROSS/BLUE SHIELD

## 2018-09-09 ENCOUNTER — Encounter (HOSPITAL_COMMUNITY): Payer: Self-pay | Admitting: Family Medicine

## 2018-09-09 ENCOUNTER — Ambulatory Visit (INDEPENDENT_AMBULATORY_CARE_PROVIDER_SITE_OTHER)
Admission: EM | Admit: 2018-09-09 | Discharge: 2018-09-09 | Disposition: A | Discharge status: Home / Self Care | Payer: BLUE CROSS/BLUE SHIELD | Source: Home / Self Care | Attending: Family Medicine | Admitting: Family Medicine

## 2018-09-09 ENCOUNTER — Emergency Department (HOSPITAL_COMMUNITY)
Admission: EM | Admit: 2018-09-09 | Discharge: 2018-09-09 | Disposition: A | Discharge status: Home / Self Care | Payer: BLUE CROSS/BLUE SHIELD | Attending: Emergency Medicine | Admitting: Emergency Medicine

## 2018-09-09 ENCOUNTER — Other Ambulatory Visit: Payer: Self-pay

## 2018-09-09 DIAGNOSIS — G43909 Migraine, unspecified, not intractable, without status migrainosus: Secondary | ICD-10-CM | POA: Insufficient documentation

## 2018-09-09 DIAGNOSIS — R479 Unspecified speech disturbances: Secondary | ICD-10-CM | POA: Diagnosis not present

## 2018-09-09 DIAGNOSIS — G43109 Migraine with aura, not intractable, without status migrainosus: Secondary | ICD-10-CM

## 2018-09-09 DIAGNOSIS — R51 Headache: Secondary | ICD-10-CM | POA: Diagnosis not present

## 2018-09-09 DIAGNOSIS — H538 Other visual disturbances: Secondary | ICD-10-CM | POA: Diagnosis not present

## 2018-09-09 DIAGNOSIS — Z8669 Personal history of other diseases of the nervous system and sense organs: Secondary | ICD-10-CM

## 2018-09-09 DIAGNOSIS — R200 Anesthesia of skin: Secondary | ICD-10-CM | POA: Diagnosis not present

## 2018-09-09 LAB — I-STAT CHEM 8, ED
BUN: 4 mg/dL — AB (ref 6–20)
CALCIUM ION: 1.19 mmol/L (ref 1.15–1.40)
CHLORIDE: 103 mmol/L (ref 98–111)
Creatinine, Ser: 0.6 mg/dL (ref 0.44–1.00)
GLUCOSE: 120 mg/dL — AB (ref 70–99)
HCT: 39 % (ref 36.0–46.0)
Hemoglobin: 13.3 g/dL (ref 12.0–15.0)
Potassium: 3.6 mmol/L (ref 3.5–5.1)
SODIUM: 139 mmol/L (ref 135–145)
TCO2: 27 mmol/L (ref 22–32)

## 2018-09-09 LAB — I-STAT BETA HCG BLOOD, ED (MC, WL, AP ONLY): I-stat hCG, quantitative: <5 m[IU]/mL (ref <5)

## 2018-09-09 NOTE — ED Notes (Signed)
PT returns from MRI 

## 2018-09-09 NOTE — ED Triage Notes (Addendum)
Pt sent here from [facility] for head discomfort, vision problems in both eyes and expressive speech issues beginning around 1030. Pt reports speech has resolved and blob in her eyes has gone away. Pt is alert and oriented. No drifts noted. Hx of migraines.

## 2018-09-09 NOTE — ED Provider Notes (Addendum)
[facility] EMERGENCY DEPARTMENT Provider Note   CSN: [id number] Arrival date & time: 09/09/18  1330     History   Chief Complaint Chief Complaint  Patient presents with  . Headache  . Eye Problem    HPI Shiesha Jahn is a 32 y.o. female.  HPI  32 year old female female last known normal approximately 11 AM.  She began having some blurring in her visual fields bilaterally.  She then began having some decreased vision peripherally.  Has not had any difficulty walking or using either of her upper extremities.  She did have some word finding difficulty    She reports migraines as early teen with episodes of confusion.   Past Medical History:  Diagnosis Date  . Chicken pox   . Headache   . History of UTI   . Hx of varicella   . Migraine     Patient Active Problem List   Diagnosis Date Noted  . GAD (generalized anxiety disorder) 12/18/2017  . Encounter for infertility 07/19/2017  . Migraine with status migrainosus, not intractable 08/14/2016  . Preventative health care 08/14/2016  . Obesity 08/14/2016    Past Surgical History:  Procedure Laterality Date  . APPENDECTOMY    . FEMUR SURGERY    . SPONTANEOUS HIP DISLOCATION REPAIR    . TONSILLECTOMY       OB History    Gravida  1   Para  1   Term  1   Preterm  0   AB  0   Living  1     SAB  0   TAB  0   Ectopic  0   Multiple  0   Live Births  1            Home Medications    Prior to Admission medications   Not on File    Family History Family History  Problem Relation Age of Onset  . Diabetes Paternal Grandmother   . Cancer Paternal Grandmother        lung  . Hyperlipidemia Mother   . Arthritis Mother   . Cancer Maternal Grandmother        breast  . Cancer Maternal Grandfather        nelanoma    Social History Social History   Tobacco Use  . Smoking status: Never Smoker  . Smokeless tobacco: Never Used  Substance Use Topics  . Alcohol use: No    . Drug use: No     Allergies   Penicillins   Review of Systems Review of Systems   Physical Exam Updated Vital Signs BP (!) 152/84   Pulse 77   Temp 98.3 F (36.8 C) (Oral)   Resp 16   Ht 1.702 m (5\' 7" )   Wt 90.7 kg   SpO2 100%   BMI 31.32 kg/m    Physical Exam  Constitutional: She is oriented to person, place, and time. She appears well-developed and well-nourished.  HENT:  Head: Normocephalic and atraumatic.  Mouth/Throat: Oropharynx is clear and moist.  Eyes: Pupils are equal, round, and reactive to light. EOM are normal.  Neck: Normal range of motion. Neck supple.  Cardiovascular: Normal rate and regular rhythm.  Pulmonary/Chest: Effort normal and breath sounds normal.  Abdominal: Soft. Bowel sounds are normal.  Musculoskeletal: Normal range of motion.  Neurological: She is alert and oriented to person, place, and time. She has normal strength. She is not disoriented. She displays normal reflexes. No cranial nerve  deficit or sensory deficit. She displays a negative Romberg sign. Coordination and gait normal.  No visual field cut  Skin: Skin is warm and dry. Capillary refill takes less than 2 seconds.  Psychiatric: She has a normal mood and affect. Her behavior is normal.  Nursing note and vitals reviewed.    ED Treatments / Results  Labs (all labs ordered are listed, but only abnormal results are displayed) Labs Reviewed  I-STAT BETA HCG BLOOD, ED ([facility], [facility], [facility] ONLY)  I-STAT CHEM 8, ED    EKG None  Radiology No results found.  Procedures Procedures (including critical care time)  Medications Ordered in ED Medications - No data to display   Initial Impression / Assessment and Plan / ED Course  I have reviewed the triage vital signs and the nursing notes.  Pertinent labs & imaging results that were available during my care of the patient were reviewed by me and considered in my medical decision making (see chart for details).    Discussed with  Dr. Jerrell Belfast.  Plan MRI .  Patient's symptoms most consistent with complex migraine, but since she has not had them for several years, will obtain MRI discussed  Final Clinical Impressions(s) / ED Diagnoses   Final diagnoses:  Migraine with aura and without status migrainosus, not intractable    ED Discharge Orders    None      Margarita Grizzle, MD 09/09/18 Avanell Shackleton  Margarita Grizzle, MD 09/09/18 1904

## 2018-09-09 NOTE — Discharge Instructions (Signed)
GO TO ER °

## 2018-09-09 NOTE — ED Provider Notes (Signed)
[facility]    CSN: [id number] Arrival date & time: 09/09/18  1242     History   Chief Complaint Chief Complaint  Patient presents with  . Headache    HPI Laura Gross is a 32 y.o. female.   HPI  She with a remote history of migraines.  Has not had one for years visual aura.  She will have some vomiting, sleep, and they are "over quickly". Today she is having unusual symptoms.  Numbness in her fingers that comes and goes.  She has a period of speech difficulty where she could not find the correct word, and her coworker stated she was talking nonsense. She is also had some visual acuity changes.  Initially had a central defect in both eyes, then moved to the peripheral of both eyes, now it is better.  Very mild headache.  No nausea or vomiting.  No family history of migraine, tumor, AVM, aneurysm  Past Medical History:  Diagnosis Date  . Chicken pox   . Headache   . History of UTI   . Hx of varicella   . Migraine     Patient Active Problem List   Diagnosis Date Noted  . GAD (generalized anxiety disorder) 12/18/2017  . Encounter for infertility 07/19/2017  . Migraine with status migrainosus, not intractable 08/14/2016  . Preventative health care 08/14/2016  . Obesity 08/14/2016    Past Surgical History:  Procedure Laterality Date  . APPENDECTOMY    . FEMUR SURGERY    . SPONTANEOUS HIP DISLOCATION REPAIR    . TONSILLECTOMY      OB History    Gravida  1   Para  1   Term  1   Preterm  0   AB  0   Living  1     SAB  0   TAB  0   Ectopic  0   Multiple  0   Live Births  1            Home Medications    Prior to Admission medications   Not on File    Family History Family History  Problem Relation Age of Onset  . Diabetes Paternal Grandmother   . Cancer Paternal Grandmother        lung  . Hyperlipidemia Mother   . Arthritis Mother   . Cancer Maternal Grandmother        breast  . Cancer Maternal Grandfather        nelanoma    Social History Social History   Tobacco Use  . Smoking status: Never Smoker  . Smokeless tobacco: Never Used  Substance Use Topics  . Alcohol use: No  . Drug use: No     Allergies   Penicillins   Review of Systems Review of Systems  Constitutional: Negative for chills and fever.  HENT: Negative for ear pain and sore throat.   Eyes: Positive for visual disturbance. Negative for pain.  Respiratory: Negative for cough and shortness of breath.   Cardiovascular: Negative for chest pain and palpitations.  Gastrointestinal: Negative for abdominal pain and vomiting.  Genitourinary: Negative for dysuria and hematuria.  Musculoskeletal: Negative for arthralgias and back pain.  Skin: Negative for color change and rash.  Neurological: Positive for speech difficulty, numbness and headaches. Negative for seizures and syncope.  All other systems reviewed and are negative.    Physical Exam Triage Vital Signs ED Triage Vitals [09/09/18 1252]  Enc Vitals Group  BP 139/85     Pulse Rate 89     Resp 18     Temp 98.2 F (36.8 C)     Temp Source Oral     SpO2 100 %     Weight      Height      Head Circumference      Peak Flow      Pain Score 5     Pain Loc      Pain Edu?      Excl. in GC?    No data found.  Updated Vital Signs BP 139/85 (BP Location: Left Arm)   Pulse 89   Temp 98.2 F (36.8 C) (Oral)   Resp 18   SpO2 100%      Physical Exam  Constitutional: She appears well-developed and well-nourished. No distress.  HENT:  Head: Normocephalic and atraumatic.  Mouth/Throat: Oropharynx is clear and moist.  Eyes: Pupils are equal, round, and reactive to light. Conjunctivae and EOM are normal.  Disks are flat  Neck: Normal range of motion.  Cardiovascular: Normal rate.  Pulmonary/Chest: Effort normal. No respiratory distress.  Abdominal: Soft. She exhibits no distension.  Musculoskeletal: Normal range of motion. She exhibits no edema.    Neurological: She is alert. She has normal strength. She displays normal reflexes. Gait normal.  Skin: Skin is warm and dry.  Psychiatric: She has a normal mood and affect. Her behavior is normal.  Somewhat bland     UC Treatments / Results  Labs (all labs ordered are listed, but only abnormal results are displayed) Labs Reviewed - No data to display  EKG None  Radiology Mr Brain Wo Contrast  Result Date: 09/09/2018 CLINICAL DATA:  Intermittent blurry vision and bilateral hand numbness. EXAM: MRI HEAD WITHOUT CONTRAST TECHNIQUE: Multiplanar, multiecho pulse sequences of the brain and surrounding structures were obtained without intravenous contrast. COMPARISON:  None. FINDINGS: BRAIN: There is no acute infarct, acute hemorrhage, hydrocephalus or extra-axial collection. The midline structures are normal. No midline shift or other mass effect. There are no old infarcts. The white matter signal is normal for the patient's age. The cerebral and cerebellar volume are age-appropriate. Susceptibility-sensitive sequences show no chronic microhemorrhage or superficial siderosis. VASCULAR: Major intracranial arterial and venous sinus flow voids are normal. SKULL AND UPPER CERVICAL SPINE: Calvarial bone marrow signal is normal. There is no skull base mass. Visualized upper cervical spine and soft tissues are normal. SINUSES/ORBITS: No fluid levels or advanced mucosal thickening. No mastoid or middle ear effusion. The orbits are normal. IMPRESSION: Normal MRI of the brain. Electronically Signed   By: Deatra Robinson M.D.   On: 09/09/2018 17:52    Procedures Procedures (including critical care time)  Medications Ordered in UC Medications - No data to display  Initial Impression / Assessment and Plan / UC Course  I have reviewed the triage vital signs and the nursing notes.  Pertinent labs & imaging results that were available during my care of the patient were reviewed by me and considered in my  medical decision making (see chart for details).     Discussed with patient that I am unable to fully evaluate her symptoms here at the urgent care center.  No neurologic symptoms that are not typical of her usual migraine especially with her speech difficulty concern me.  I believe she needs to go to the emergency room.  She agrees.  Her husband will take her.  She is safe to travel Final Clinical Impressions(s) /  UC Diagnoses   Final diagnoses:  Difficulty with speech  History of visual field defect  History of migraine     Discharge Instructions     GO TO ER    ED Prescriptions    None     Controlled Substance Prescriptions [place] Controlled Substance Registry consulted? Not Applicable   Eustace Moore, MD 09/09/18 [phone]

## 2018-09-09 NOTE — ED Notes (Signed)
ED Provider at bedside. 

## 2018-09-09 NOTE — ED Triage Notes (Signed)
Pt here for blurred vision that is intermittent and numbness to both hands intermittently; pt sts some slurred speech today and pain in head; pt with hx of migraines

## 2018-11-26 ENCOUNTER — Telehealth: Payer: Self-pay | Admitting: Primary Care

## 2018-11-26 NOTE — Telephone Encounter (Signed)
Left message asking pt to call office pt schedule cpx with kate on 12/02/2018 her last cpx was 12/18/17  Needs to be schedule after 12/18/2018

## 2018-12-02 ENCOUNTER — Encounter: Payer: BLUE CROSS/BLUE SHIELD | Admitting: Primary Care

## 2019-08-26 ENCOUNTER — Encounter: Payer: BLUE CROSS/BLUE SHIELD | Admitting: Primary Care

## 2019-09-16 DIAGNOSIS — Z20828 Contact with and (suspected) exposure to other viral communicable diseases: Secondary | ICD-10-CM | POA: Diagnosis not present

## 2019-09-29 ENCOUNTER — Ambulatory Visit: Payer: BC Managed Care – PPO | Admitting: Primary Care

## 2019-10-07 ENCOUNTER — Ambulatory Visit: Payer: BC Managed Care – PPO | Admitting: Primary Care

## 2019-12-29 ENCOUNTER — Other Ambulatory Visit: Payer: Self-pay | Admitting: Primary Care

## 2019-12-29 ENCOUNTER — Telehealth: Payer: Self-pay | Admitting: Primary Care

## 2019-12-29 DIAGNOSIS — Z0000 Encounter for general adult medical examination without abnormal findings: Secondary | ICD-10-CM

## 2019-12-29 NOTE — Telephone Encounter (Signed)
Noted, labs ordered.

## 2019-12-29 NOTE — Telephone Encounter (Signed)
Patient scheduled a lab appointment for her cpx tomorrow.  Patient wants the same labs ordered as she had done for her last physical. Patient's cpx is scheduled on 01/06/20.  She'll need a form filled out for foster care, also.

## 2019-12-30 ENCOUNTER — Other Ambulatory Visit: Payer: Self-pay

## 2019-12-30 ENCOUNTER — Other Ambulatory Visit (INDEPENDENT_AMBULATORY_CARE_PROVIDER_SITE_OTHER): Payer: BC Managed Care – PPO

## 2019-12-30 DIAGNOSIS — Z0000 Encounter for general adult medical examination without abnormal findings: Secondary | ICD-10-CM | POA: Diagnosis not present

## 2019-12-30 LAB — CBC
HCT: 39.6 % (ref 36.0–46.0)
Hemoglobin: 13.6 g/dL (ref 12.0–15.0)
MCHC: 34.4 g/dL (ref 30.0–36.0)
MCV: 86.1 fl (ref 78.0–100.0)
Platelets: 267 10*3/uL (ref 150.0–400.0)
RBC: 4.6 Mil/uL (ref 3.87–5.11)
RDW: 13.1 % (ref 11.5–15.5)
WBC: 6 10*3/uL (ref 4.0–10.5)

## 2019-12-30 LAB — COMPREHENSIVE METABOLIC PANEL
ALT: 32 U/L (ref 0–35)
AST: 45 U/L — ABNORMAL HIGH (ref 0–37)
Albumin: 4.2 g/dL (ref 3.5–5.2)
Alkaline Phosphatase: 72 U/L (ref 39–117)
BUN: 9 mg/dL (ref 6–23)
CO2: 26 mEq/L (ref 19–32)
Calcium: 9.4 mg/dL (ref 8.4–10.5)
Chloride: 105 mEq/L (ref 96–112)
Creatinine, Ser: 0.7 mg/dL (ref 0.40–1.20)
GFR: 96.74 mL/min (ref >60.00)
Glucose, Bld: 109 mg/dL — ABNORMAL HIGH (ref 70–99)
Potassium: 4.2 mEq/L (ref 3.5–5.1)
Sodium: 138 mEq/L (ref 135–145)
Total Bilirubin: 0.5 mg/dL (ref 0.2–1.2)
Total Protein: 7.3 g/dL (ref 6.0–8.3)

## 2019-12-30 LAB — LIPID PANEL
Cholesterol: 151 mg/dL (ref 0–200)
HDL: 52.4 mg/dL (ref >39.00)
LDL Cholesterol: 75 mg/dL (ref 0–99)
NonHDL: 98.89
Total CHOL/HDL Ratio: 3
Triglycerides: 120 mg/dL (ref 0.0–149.0)
VLDL: 24 mg/dL (ref 0.0–40.0)

## 2019-12-30 LAB — HEMOGLOBIN A1C: Hgb A1c MFr Bld: 5 % (ref 4.6–6.5)

## 2019-12-30 LAB — TSH: TSH: 2.24 u[IU]/mL (ref 0.35–4.50)

## 2020-01-06 ENCOUNTER — Ambulatory Visit (INDEPENDENT_AMBULATORY_CARE_PROVIDER_SITE_OTHER): Payer: BC Managed Care – PPO | Admitting: Primary Care

## 2020-01-06 ENCOUNTER — Other Ambulatory Visit: Payer: Self-pay

## 2020-01-06 ENCOUNTER — Encounter: Payer: Self-pay | Admitting: Primary Care

## 2020-01-06 VITALS — BP 126/82 | HR 74 | Temp 97.1°F | Ht 67.0 in | Wt 246.5 lb

## 2020-01-06 DIAGNOSIS — G43901 Migraine, unspecified, not intractable, with status migrainosus: Secondary | ICD-10-CM

## 2020-01-06 DIAGNOSIS — Z0000 Encounter for general adult medical examination without abnormal findings: Secondary | ICD-10-CM | POA: Diagnosis not present

## 2020-01-06 DIAGNOSIS — F411 Generalized anxiety disorder: Secondary | ICD-10-CM

## 2020-01-06 NOTE — Assessment & Plan Note (Signed)
Tetanus and influenza due, she declines. Pap smear due, she declines today and will make an appointment with GYN.  Discussed the importance of a healthy diet and regular exercise in order for weight loss, and to reduce the risk of any potential medical problems.  Exam today unremarkable.  Labs reviewed and unremarkable.  Form completed.

## 2020-01-06 NOTE — Patient Instructions (Addendum)
Start exercising. You should be getting 150 minutes of moderate intensity exercise weekly.  It's important to improve your diet by reducing consumption of fast food, fried food, processed snack foods, sugary drinks. Increase consumption of fresh vegetables and fruits, whole grains, water.  Ensure you are drinking 64 ounces of water daily.  Schedule a visit with your gynecologist for your pap smear.   It was a pleasure to see you today!   Preventive Care 31-69 Years Old, Female Preventive care refers to visits with your health care provider and lifestyle choices that can promote health and wellness. This includes:  A yearly physical exam. This may also be called an annual well check.  Regular dental visits and eye exams.  Immunizations.  Screening for certain conditions.  Healthy lifestyle choices, such as eating a healthy diet, getting regular exercise, not using drugs or products that contain nicotine and tobacco, and limiting alcohol use. What can I expect for my preventive care visit? Physical exam Your health care provider will check your:  Height and weight. This may be used to calculate body mass index (BMI), which tells if you are at a healthy weight.  Heart rate and blood pressure.  Skin for abnormal spots. Counseling Your health care provider may ask you questions about your:  Alcohol, tobacco, and drug use.  Emotional well-being.  Home and relationship well-being.  Sexual activity.  Eating habits.  Work and work Statistician.  Method of birth control.  Menstrual cycle.  Pregnancy history. What immunizations do I need?  Influenza (flu) vaccine  This is recommended every year. Tetanus, diphtheria, and pertussis (Tdap) vaccine  You may need a Td booster every 10 years. Varicella (chickenpox) vaccine  You may need this if you have not been vaccinated. Human papillomavirus (HPV) vaccine  If recommended by your health care provider, you may need  three doses over 6 months. Measles, mumps, and rubella (MMR) vaccine  You may need at least one dose of MMR. You may also need a second dose. Meningococcal conjugate (MenACWY) vaccine  One dose is recommended if you are age 77-21 years and a first-year college student living in a residence hall, or if you have one of several medical conditions. You may also need additional booster doses. Pneumococcal conjugate (PCV13) vaccine  You may need this if you have certain conditions and were not previously vaccinated. Pneumococcal polysaccharide (PPSV23) vaccine  You may need one or two doses if you smoke cigarettes or if you have certain conditions. Hepatitis A vaccine  You may need this if you have certain conditions or if you travel or work in places where you may be exposed to hepatitis A. Hepatitis B vaccine  You may need this if you have certain conditions or if you travel or work in places where you may be exposed to hepatitis B. Haemophilus influenzae type b (Hib) vaccine  You may need this if you have certain conditions. You may receive vaccines as individual doses or as more than one vaccine together in one shot (combination vaccines). Talk with your health care provider about the risks and benefits of combination vaccines. What tests do I need?  Blood tests  Lipid and cholesterol levels. These may be checked every 5 years starting at age 26.  Hepatitis C test.  Hepatitis B test. Screening  Diabetes screening. This is done by checking your blood sugar (glucose) after you have not eaten for a while (fasting).  Sexually transmitted disease (STD) testing.  BRCA-related cancer screening. This  may be done if you have a family history of breast, ovarian, tubal, or peritoneal cancers.  Pelvic exam and Pap test. This may be done every 3 years starting at age 26. Starting at age 55, this may be done every 5 years if you have a Pap test in combination with an HPV test. Talk with your  health care provider about your test results, treatment options, and if necessary, the need for more tests. Follow these instructions at home: Eating and drinking   Eat a diet that includes fresh fruits and vegetables, whole grains, lean protein, and low-fat dairy.  Take vitamin and mineral supplements as recommended by your health care provider.  Do not drink alcohol if: ? Your health care provider tells you not to drink. ? You are pregnant, may be pregnant, or are planning to become pregnant.  If you drink alcohol: ? Limit how much you have to 0-1 drink a day. ? Be aware of how much alcohol is in your drink. In the [place] one drink equals one 12 oz bottle of beer (355 mL), one 5 oz glass of wine (148 mL), or one 1 oz glass of hard liquor (44 mL). Lifestyle  Take daily care of your teeth and gums.  Stay active. Exercise for at least 30 minutes on 5 or more days each week.  Do not use any products that contain nicotine or tobacco, such as cigarettes, e-cigarettes, and chewing tobacco. If you need help quitting, ask your health care provider.  If you are sexually active, practice safe sex. Use a condom or other form of birth control (contraception) in order to prevent pregnancy and STIs (sexually transmitted infections). If you plan to become pregnant, see your health care provider for a preconception visit. What's next?  Visit your health care provider once a year for a well check visit.  Ask your health care provider how often you should have your eyes and teeth checked.  Stay up to date on all vaccines. This information is not intended to replace advice given to you by your health care provider. Make sure you discuss any questions you have with your health care provider. Document Revised: 06/26/2018 Document Reviewed: 06/26/2018 Elsevier Patient Education  2020 [employer].

## 2020-01-06 NOTE — Assessment & Plan Note (Signed)
Denies concerns for anxiety and depression.  Continue to monitor.  

## 2020-01-06 NOTE — Assessment & Plan Note (Signed)
No migraines since November 2019. Continue to monitor.

## 2020-01-06 NOTE — Progress Notes (Signed)
Subjective:    Patient ID: Laura Gross, female    DOB: 08-22-1986, 35 y.o.   MRN: [id number]  HPI  This visit occurred during the SARS-CoV-2 public health emergency.  Safety protocols were in place, including screening questions prior to the visit, additional usage of staff PPE, and extensive cleaning of exam room while observing appropriate contact time as indicated for disinfecting solutions.   Laura Gross is a 34 year old female who presents today for complete physical and form completion. 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   Immunizations: -Tetanus: Completed last in 2001, declines  -Influenza: Declines   Diet: She endorses a fair diet.  Exercise: She is active, no regular exercise.   Eye exam: No recent exam Dental exam: No recent exam  Pap Smear: Completed 5 years ago, she will set up a visit with her GYN.  BP Readings from Last 3 Encounters:  01/06/20 126/82  09/09/18 126/85  09/09/18 139/85     Review of Systems  Constitutional: Negative for unexpected weight change.  HENT: Negative for rhinorrhea.   Respiratory: Negative for cough and shortness of breath.   Cardiovascular: Negative for chest pain.  Gastrointestinal: Negative for constipation and diarrhea.  Genitourinary: Negative for difficulty urinating.  Musculoskeletal: Negative for arthralgias and myalgias.  Skin: Negative for rash.  Allergic/Immunologic: Negative for environmental allergies.  Neurological: Negative for dizziness, numbness and headaches.  Psychiatric/Behavioral: The patient is not nervous/anxious.        Past Medical History:  Diagnosis Date  . Chicken pox   . Headache   . History of UTI   . Hx of varicella   . Migraine      Social History   Socioeconomic History  . Marital status: Married    Spouse name: Not on file  . Number of children: Not on file  . Years of education: Not on file  . Highest education level: Not on file    Occupational History  . Not on file  Tobacco Use  . Smoking status: Never Smoker  . Smokeless tobacco: Never Used  Substance and Sexual Activity  . Alcohol use: No  . Drug use: No  . Sexual activity: Yes  Other Topics Concern  . Not on file  Social History Narrative  . Not on file   Social Determinants of Health   Financial Resource Strain:   . Difficulty of Paying Living Expenses: Not on file  Food Insecurity:   . Worried About Charity fundraiser in the Last Year: Not on file  . Ran Out of Food in the Last Year: Not on file  Transportation Needs:   . Lack of Transportation (Medical): Not on file  . Lack of Transportation (Non-Medical): Not on file  Physical Activity:   . Days of Exercise per Week: Not on file  . Minutes of Exercise per Session: Not on file  Stress:   . Feeling of Stress : Not on file  Social Connections:   . Frequency of Communication with Friends and Family: Not on file  . Frequency of Social Gatherings with Friends and Family: Not on file  . Attends Religious Services: Not on file  . Active Member of Clubs or Organizations: Not on file  . Attends Archivist Meetings: Not on file  . Marital Status: Not on file  Intimate Partner Violence:   . Fear of Current or Ex-Partner: Not on file  . Emotionally Abused: Not on file  . Physically Abused:  Not on file  . Sexually Abused: Not on file    Past Surgical History:  Procedure Laterality Date  . APPENDECTOMY    . FEMUR SURGERY    . SPONTANEOUS HIP DISLOCATION REPAIR    . TONSILLECTOMY      Family History  Problem Relation Age of Onset  . Diabetes Paternal Grandmother   . Cancer Paternal Grandmother        lung  . Hyperlipidemia Mother   . Arthritis Mother   . Cancer Maternal Grandmother        breast  . Cancer Maternal Grandfather        nelanoma    Allergies  Allergen Reactions  . Penicillins Hives    No current outpatient medications on file prior to visit.   No current  facility-administered medications on file prior to visit.    BP 126/82   Pulse 74   Temp (!) 97.1 F (36.2 C) (Temporal)   Ht 5\' 7"  (1.702 m)   Wt 246 lb 8 oz (111.8 kg)   LMP 12/20/2019   SpO2 98%   BMI 38.61 kg/m    Objective:   Physical Exam  Constitutional: She is oriented to person, place, and time. She appears well-nourished.  HENT:  Right Ear: Tympanic membrane and ear canal normal.  Left Ear: Tympanic membrane and ear canal normal.  Mouth/Throat: Oropharynx is clear and moist.  Eyes: Pupils are equal, round, and reactive to light. EOM are normal.  Cardiovascular: Normal rate and regular rhythm.  Respiratory: Effort normal and breath sounds normal.  GI: Soft. Bowel sounds are normal. There is no abdominal tenderness.  Musculoskeletal:        General: Normal range of motion.     Cervical back: Neck supple.  Neurological: She is alert and oriented to person, place, and time. No cranial nerve deficit.  Reflex Scores:      Patellar reflexes are 2+ on the right side and 2+ on the left side. Skin: Skin is warm and dry.  Psychiatric: She has a normal mood and affect.           Assessment & Plan:

## 2020-03-04 ENCOUNTER — Emergency Department
Admission: EM | Admit: 2020-03-04 | Discharge: 2020-03-04 | Disposition: A | Discharge status: Home / Self Care | Payer: BC Managed Care – PPO | Attending: Emergency Medicine | Admitting: Emergency Medicine

## 2020-03-04 ENCOUNTER — Other Ambulatory Visit: Payer: Self-pay

## 2020-03-04 ENCOUNTER — Emergency Department: Payer: BC Managed Care – PPO

## 2020-03-04 DIAGNOSIS — Y999 Unspecified external cause status: Secondary | ICD-10-CM | POA: Diagnosis not present

## 2020-03-04 DIAGNOSIS — S42352A Displaced comminuted fracture of shaft of humerus, left arm, initial encounter for closed fracture: Secondary | ICD-10-CM | POA: Diagnosis not present

## 2020-03-04 DIAGNOSIS — W19XXXA Unspecified fall, initial encounter: Secondary | ICD-10-CM

## 2020-03-04 DIAGNOSIS — S59912A Unspecified injury of left forearm, initial encounter: Secondary | ICD-10-CM | POA: Diagnosis not present

## 2020-03-04 DIAGNOSIS — S42412A Displaced simple supracondylar fracture without intercondylar fracture of left humerus, initial encounter for closed fracture: Secondary | ICD-10-CM

## 2020-03-04 DIAGNOSIS — Y9352 Activity, horseback riding: Secondary | ICD-10-CM | POA: Diagnosis not present

## 2020-03-04 DIAGNOSIS — S4992XA Unspecified injury of left shoulder and upper arm, initial encounter: Secondary | ICD-10-CM | POA: Diagnosis not present

## 2020-03-04 DIAGNOSIS — Y929 Unspecified place or not applicable: Secondary | ICD-10-CM | POA: Insufficient documentation

## 2020-03-04 DIAGNOSIS — S79912A Unspecified injury of left hip, initial encounter: Secondary | ICD-10-CM | POA: Diagnosis not present

## 2020-03-04 DIAGNOSIS — S42302A Unspecified fracture of shaft of humerus, left arm, initial encounter for closed fracture: Secondary | ICD-10-CM | POA: Diagnosis not present

## 2020-03-04 DIAGNOSIS — S42332A Displaced oblique fracture of shaft of humerus, left arm, initial encounter for closed fracture: Secondary | ICD-10-CM | POA: Diagnosis not present

## 2020-03-04 HISTORY — DX: Displaced simple supracondylar fracture without intercondylar fracture of left humerus, initial encounter for closed fracture: S42.412A

## 2020-03-04 LAB — POCT PREGNANCY, URINE: Preg Test, Ur: NEGATIVE

## 2020-03-04 MED ORDER — HYDROMORPHONE HCL 1 MG/ML IJ SOLN
1.0000 mg | Freq: Once | INTRAMUSCULAR | Status: AC
  Administered 2020-03-04: 1 mg via INTRAMUSCULAR
  Filled 2020-03-04: qty 1

## 2020-03-04 MED ORDER — OXYCODONE-ACETAMINOPHEN 5-325 MG PO TABS: 1.0000 | ORAL_TABLET | Freq: Three times a day (TID) | ORAL | 0 refills | Status: DC | PRN

## 2020-03-04 MED ORDER — OXYCODONE-ACETAMINOPHEN 5-325 MG PO TABS
2.0000 | ORAL_TABLET | Freq: Once | ORAL | Status: AC
  Administered 2020-03-04: 2 via ORAL
  Filled 2020-03-04: qty 2

## 2020-03-04 MED ORDER — DIAZEPAM 5 MG PO TABS
5.0000 mg | ORAL_TABLET | Freq: Once | ORAL | Status: AC
  Administered 2020-03-04: 5 mg via ORAL
  Filled 2020-03-04: qty 1

## 2020-03-04 NOTE — ED Triage Notes (Signed)
Patient reports riding her horse and it bucked her off.  Patient reports pain to entire left arm.Marland Kitchen

## 2020-03-04 NOTE — ED Provider Notes (Signed)
[facility] Emergency Department Provider Note       Time seen: ----------------------------------------- 8:27 PM on 03/04/2020 -----------------------------------------   I have reviewed the triage vital signs and the nursing notes.  HISTORY   Chief Complaint Fall    HPI Laura Gross is a 34 y.o. female with a history of headache, UTI, migraine, anxiety who presents to the ED for left arm pain after a horse riding accident.  Patient reports the horse bucked her off and she landed on the left arm.  Pain is 10 out of 10 in the left arm.  She also has some pain in the left hip.  Past Medical History:  Diagnosis Date  . Chicken pox   . Headache   . History of UTI   . Hx of varicella   . Migraine     Patient Active Problem List   Diagnosis Date Noted  . GAD (generalized anxiety disorder) 12/18/2017  . Encounter for infertility 07/19/2017  . Migraine with status migrainosus, not intractable 08/14/2016  . Preventative health care 08/14/2016  . Obesity 08/14/2016    Past Surgical History:  Procedure Laterality Date  . APPENDECTOMY    . FEMUR SURGERY    . SPONTANEOUS HIP DISLOCATION REPAIR    . TONSILLECTOMY      Allergies Penicillins  Social History Social History   Tobacco Use  . Smoking status: Never Smoker  . Smokeless tobacco: Never Used  Substance Use Topics  . Alcohol use: No  . Drug use: No    Review of Systems Constitutional: Negative for fever. Cardiovascular: Negative for chest pain. Respiratory: Negative for shortness of breath. Gastrointestinal: Negative for abdominal pain, vomiting and diarrhea. Musculoskeletal: Positive for left arm pain and left hip pain Skin: Negative for rash. Neurological: Negative for headaches, focal weakness or numbness.  All systems negative/normal/unremarkable except as stated in the HPI  ____________________________________________   PHYSICAL EXAM:  VITAL SIGNS: ED Triage  Vitals  Enc Vitals Group     BP 03/04/20 2022 (!) 150/68     Pulse Rate 03/04/20 2022 75     Resp 03/04/20 2022 18     Temp 03/04/20 2022 98 F (36.7 C)     Temp Source 03/04/20 2022 Oral     SpO2 03/04/20 2022 100 %     Weight 03/04/20 2019 236 lb (107 kg)     Height 03/04/20 2019 5\' 6"  (1.676 m)     Head Circumference --      Peak Flow --      Pain Score 03/04/20 2019 10     Pain Loc --      Pain Edu? --      Excl. in [place]? --    Constitutional: Alert and oriented. Well appearing and in no distress. Eyes: Conjunctivae are normal. Normal extraocular movements. ENT      Head: Normocephalic and atraumatic.      Nose: No congestion/rhinnorhea.      Mouth/Throat: Mucous membranes are moist.      Neck: No stridor. Cardiovascular: Normal rate, regular rhythm. No murmurs, rubs, or gallops. Respiratory: Normal respiratory effort without tachypnea nor retractions. Breath sounds are clear and equal bilaterally. No wheezes/rales/rhonchi. Gastrointestinal: Soft and nontender. Normal bowel sounds Musculoskeletal: There is ecchymosis over the right mid upper arm, mild erythema to the left upper arm as well with specific tenderness.  Patient is holding her arm up against her body.  Also tenderness over the left forearm. Neurologic:  Normal speech and language.  No gross focal neurologic deficits are appreciated.  Skin: Mild erythema and ecchymosis in the left arm Psychiatric: Mood and affect are normal. Speech and behavior are normal.  ____________________________________________  ED COURSE:  As part of my medical decision making, I reviewed the following data within the electronic MEDICAL RECORD NUMBER History obtained from family if available, nursing notes, old chart and ekg, as well as notes from prior ED visits. Patient presented for a horse riding accident, we will assess with imaging as indicated at this time.   Procedures  Laura Gross was evaluated in Emergency Department on  03/04/2020 for the symptoms described in the history of present illness. She was evaluated in the context of the global COVID-19 pandemic, which necessitated consideration that the patient might be at risk for infection with the SARS-CoV-2 virus that causes COVID-19. Institutional protocols and algorithms that pertain to the evaluation of patients at risk for COVID-19 are in a state of rapid change based on information released by regulatory bodies including the CDC and federal and state organizations. These policies and algorithms were followed during the patient's care in the ED.  ____________________________________________   RADIOLOGY Images were viewed by me  Left humerus x-ray, left forearm x-ray IMPRESSION: 1. Mildly displaced, angulated midshaft fracture of the left humerus. IMPRESSION: 1. No fracture or acute finding. 2. Arthropathic changes of the left hip at have advanced compared to radiographs from 2008. 3. Well seated, well positioned fixation plate across the left femur from treatment of an old fracture. IMPRESSION: 1. Unremarkable left forearm. 2. Please refer to dedicated left humerus evaluation describing humeral diaphyseal fracture. ____________________________________________   DIFFERENTIAL DIAGNOSIS   Fall, contusion, fracture, dislocation  FINAL ASSESSMENT AND PLAN  Horse riding accident, left humerus fracture   Plan: The patient had presented for a horse riding accident. Patient's imaging did reveal a angulated midshaft fracture of the left humerus.  Patient was placed in a coaptation splint as well as shoulder immobilizer.  I have discussed with orthopedics and she will follow up on Wednesday of next week.   Ulice Dash, MD    Note: This note was generated in part or whole with voice recognition software. Voice recognition is usually quite accurate but there are transcription errors that can and very often do occur. I apologize for any  typographical errors that were not detected and corrected.     Emily Filbert, MD 03/04/20 2227

## 2020-03-07 ENCOUNTER — Other Ambulatory Visit (HOSPITAL_COMMUNITY)
Admission: RE | Admit: 2020-03-07 | Discharge: 2020-03-07 | Disposition: A | Discharge status: Home / Self Care | Payer: BC Managed Care – PPO | Source: Ambulatory Visit | Attending: Orthopedic Surgery | Admitting: Orthopedic Surgery

## 2020-03-07 ENCOUNTER — Encounter (HOSPITAL_BASED_OUTPATIENT_CLINIC_OR_DEPARTMENT_OTHER): Payer: Self-pay | Admitting: Orthopedic Surgery

## 2020-03-07 ENCOUNTER — Other Ambulatory Visit: Payer: Self-pay

## 2020-03-07 DIAGNOSIS — Z20822 Contact with and (suspected) exposure to covid-19: Secondary | ICD-10-CM | POA: Diagnosis not present

## 2020-03-07 DIAGNOSIS — Z01812 Encounter for preprocedural laboratory examination: Secondary | ICD-10-CM | POA: Diagnosis not present

## 2020-03-07 DIAGNOSIS — M25511 Pain in right shoulder: Secondary | ICD-10-CM | POA: Diagnosis not present

## 2020-03-07 LAB — SARS CORONAVIRUS 2 (TAT 6-24 HRS): SARS Coronavirus 2: NEGATIVE

## 2020-03-07 NOTE — Progress Notes (Addendum)
Spoke w/ via phone for pre-op interview---patient Lab needs dos----    Urine poct  (urine poct   03-04-2020 epic, ask mda if needed repeating day of surgery)      COVID test ------03-07-2020 at 1055 am Arrive at -------1200 pm 03-08-2020 No food after midnight, clear liquids from midnight until 800 am then npo Medications to take morning of surgery -----oxycodone prn Diabetic medication -----n/a Patient Special Instructions -----none Pre-Op special Istructions -----none Patient verbalized understanding of instructions that were given at this phone interview. Patient denies shortness of breath, chest pain, fever, cough a this phone interview.  Patient has nose piercing, will try to remove

## 2020-03-08 ENCOUNTER — Other Ambulatory Visit: Payer: Self-pay

## 2020-03-08 ENCOUNTER — Ambulatory Visit (HOSPITAL_BASED_OUTPATIENT_CLINIC_OR_DEPARTMENT_OTHER): Payer: BC Managed Care – PPO | Admitting: Anesthesiology

## 2020-03-08 ENCOUNTER — Encounter (HOSPITAL_BASED_OUTPATIENT_CLINIC_OR_DEPARTMENT_OTHER)
Admission: RE | Disposition: A | Discharge status: Home / Self Care | Payer: Self-pay | Source: Ambulatory Visit | Attending: Orthopedic Surgery

## 2020-03-08 ENCOUNTER — Encounter (HOSPITAL_BASED_OUTPATIENT_CLINIC_OR_DEPARTMENT_OTHER): Payer: Self-pay | Admitting: Orthopedic Surgery

## 2020-03-08 ENCOUNTER — Ambulatory Visit (HOSPITAL_BASED_OUTPATIENT_CLINIC_OR_DEPARTMENT_OTHER)
Admission: RE | Admit: 2020-03-08 | Discharge: 2020-03-08 | Disposition: A | Discharge status: Home / Self Care | Payer: BC Managed Care – PPO | Source: Ambulatory Visit | Attending: Orthopedic Surgery | Admitting: Orthopedic Surgery

## 2020-03-08 DIAGNOSIS — Z803 Family history of malignant neoplasm of breast: Secondary | ICD-10-CM | POA: Diagnosis not present

## 2020-03-08 DIAGNOSIS — F419 Anxiety disorder, unspecified: Secondary | ICD-10-CM | POA: Insufficient documentation

## 2020-03-08 DIAGNOSIS — S42302A Unspecified fracture of shaft of humerus, left arm, initial encounter for closed fracture: Secondary | ICD-10-CM | POA: Diagnosis not present

## 2020-03-08 DIAGNOSIS — Z8261 Family history of arthritis: Secondary | ICD-10-CM | POA: Diagnosis not present

## 2020-03-08 DIAGNOSIS — Z880 Allergy status to penicillin: Secondary | ICD-10-CM | POA: Insufficient documentation

## 2020-03-08 DIAGNOSIS — F411 Generalized anxiety disorder: Secondary | ICD-10-CM | POA: Diagnosis not present

## 2020-03-08 DIAGNOSIS — G43909 Migraine, unspecified, not intractable, without status migrainosus: Secondary | ICD-10-CM | POA: Diagnosis not present

## 2020-03-08 DIAGNOSIS — Z833 Family history of diabetes mellitus: Secondary | ICD-10-CM | POA: Diagnosis not present

## 2020-03-08 DIAGNOSIS — S42332A Displaced oblique fracture of shaft of humerus, left arm, initial encounter for closed fracture: Secondary | ICD-10-CM | POA: Diagnosis not present

## 2020-03-08 DIAGNOSIS — Z808 Family history of malignant neoplasm of other organs or systems: Secondary | ICD-10-CM | POA: Insufficient documentation

## 2020-03-08 DIAGNOSIS — Z801 Family history of malignant neoplasm of trachea, bronchus and lung: Secondary | ICD-10-CM | POA: Diagnosis not present

## 2020-03-08 DIAGNOSIS — E669 Obesity, unspecified: Secondary | ICD-10-CM | POA: Diagnosis not present

## 2020-03-08 DIAGNOSIS — Y9389 Activity, other specified: Secondary | ICD-10-CM | POA: Diagnosis not present

## 2020-03-08 DIAGNOSIS — Z791 Long term (current) use of non-steroidal anti-inflammatories (NSAID): Secondary | ICD-10-CM | POA: Diagnosis not present

## 2020-03-08 DIAGNOSIS — S42392A Other fracture of shaft of left humerus, initial encounter for closed fracture: Secondary | ICD-10-CM | POA: Diagnosis not present

## 2020-03-08 DIAGNOSIS — G8918 Other acute postprocedural pain: Secondary | ICD-10-CM | POA: Diagnosis not present

## 2020-03-08 DIAGNOSIS — Z8349 Family history of other endocrine, nutritional and metabolic diseases: Secondary | ICD-10-CM | POA: Insufficient documentation

## 2020-03-08 DIAGNOSIS — Z6839 Body mass index (BMI) 39.0-39.9, adult: Secondary | ICD-10-CM | POA: Insufficient documentation

## 2020-03-08 HISTORY — PX: ORIF HUMERUS FRACTURE: SHX2126

## 2020-03-08 HISTORY — DX: Family history of other specified conditions: Z84.89

## 2020-03-08 LAB — POCT PREGNANCY, URINE: Preg Test, Ur: NEGATIVE

## 2020-03-08 SURGERY — OPEN REDUCTION INTERNAL FIXATION (ORIF) HUMERAL SHAFT FRACTURE
Anesthesia: Regional | Site: Arm Upper | Laterality: Left

## 2020-03-08 MED ORDER — ACETAMINOPHEN 500 MG PO TABS
1000.0000 mg | ORAL_TABLET | Freq: Once | ORAL | Status: AC
  Administered 2020-03-08: 1000 mg via ORAL

## 2020-03-08 MED ORDER — MIDAZOLAM HCL 2 MG/2ML IJ SOLN
INTRAMUSCULAR | Status: DC | PRN
  Administered 2020-03-08: 2 mg via INTRAVENOUS

## 2020-03-08 MED ORDER — FENTANYL CITRATE (PF) 100 MCG/2ML IJ SOLN
INTRAMUSCULAR | Status: DC | PRN
  Administered 2020-03-08 (×3): 25 ug via INTRAVENOUS
  Administered 2020-03-08: 50 ug via INTRAVENOUS
  Administered 2020-03-08 (×2): 25 ug via INTRAVENOUS

## 2020-03-08 MED ORDER — BUPIVACAINE LIPOSOME 1.3 % IJ SUSP
INTRAMUSCULAR | Status: DC | PRN
  Administered 2020-03-08: 10 mL via PERINEURAL

## 2020-03-08 MED ORDER — MIDAZOLAM HCL 2 MG/2ML IJ SOLN
INTRAMUSCULAR | Status: AC
  Filled 2020-03-08: qty 2

## 2020-03-08 MED ORDER — DEXAMETHASONE SODIUM PHOSPHATE 10 MG/ML IJ SOLN
INTRAMUSCULAR | Status: DC | PRN
  Administered 2020-03-08: 10 mg via INTRAVENOUS

## 2020-03-08 MED ORDER — LIDOCAINE 2% (20 MG/ML) 5 ML SYRINGE
INTRAMUSCULAR | Status: DC | PRN
  Administered 2020-03-08: 40 mg via INTRAVENOUS

## 2020-03-08 MED ORDER — BUPIVACAINE HCL (PF) 0.5 % IJ SOLN
INTRAMUSCULAR | Status: DC | PRN
Start: 2020-03-08 — End: 2020-03-08
  Administered 2020-03-08: 15 mL via PERINEURAL

## 2020-03-08 MED ORDER — ONDANSETRON HCL 4 MG/2ML IJ SOLN
INTRAMUSCULAR | Status: AC
  Filled 2020-03-08: qty 2

## 2020-03-08 MED ORDER — ONDANSETRON HCL 4 MG PO TABS: 4.0000 mg | ORAL_TABLET | Freq: Three times a day (TID) | ORAL | 0 refills | Status: DC | PRN

## 2020-03-08 MED ORDER — DEXAMETHASONE SODIUM PHOSPHATE 10 MG/ML IJ SOLN
INTRAMUSCULAR | Status: AC
  Filled 2020-03-08: qty 1

## 2020-03-08 MED ORDER — CELECOXIB 200 MG PO CAPS
200.0000 mg | ORAL_CAPSULE | Freq: Once | ORAL | Status: AC
  Administered 2020-03-08: 200 mg via ORAL

## 2020-03-08 MED ORDER — METHOCARBAMOL 750 MG PO TABS
750.0000 mg | ORAL_TABLET | Freq: Three times a day (TID) | ORAL | 0 refills | Status: DC | PRN
Start: 2020-03-08 — End: 2021-02-17

## 2020-03-08 MED ORDER — PROPOFOL 10 MG/ML IV BOLUS
INTRAVENOUS | Status: DC | PRN
  Administered 2020-03-08: 200 mg via INTRAVENOUS
  Administered 2020-03-08: 20 mg via INTRAVENOUS

## 2020-03-08 MED ORDER — PROPOFOL 10 MG/ML IV BOLUS
INTRAVENOUS | Status: AC
  Filled 2020-03-08: qty 20

## 2020-03-08 MED ORDER — ONDANSETRON HCL 4 MG/2ML IJ SOLN
INTRAMUSCULAR | Status: DC | PRN
  Administered 2020-03-08 (×2): 4 mg via INTRAVENOUS

## 2020-03-08 MED ORDER — OXYCODONE HCL 5 MG PO TABS: 5.0000 mg | ORAL_TABLET | ORAL | 0 refills | Status: AC | PRN

## 2020-03-08 MED ORDER — OXYCODONE HCL 5 MG/5ML PO SOLN: 5.0000 mg | Freq: Once | ORAL | Status: AC | PRN

## 2020-03-08 MED ORDER — ACETAMINOPHEN 500 MG PO TABS
1000.0000 mg | ORAL_TABLET | Freq: Three times a day (TID) | ORAL | 0 refills | Status: AC
Start: 2020-03-08 — End: 2020-03-18

## 2020-03-08 MED ORDER — CEFAZOLIN SODIUM-DEXTROSE 2-4 GM/100ML-% IV SOLN
2.0000 g | INTRAVENOUS | Status: AC
  Administered 2020-03-08: 2 g via INTRAVENOUS

## 2020-03-08 MED ORDER — LIDOCAINE 2% (20 MG/ML) 5 ML SYRINGE
INTRAMUSCULAR | Status: AC
  Filled 2020-03-08: qty 5

## 2020-03-08 MED ORDER — OXYCODONE HCL 5 MG PO TABS
ORAL_TABLET | ORAL | Status: AC
  Filled 2020-03-08: qty 1

## 2020-03-08 MED ORDER — OXYCODONE HCL 5 MG PO TABS
5.0000 mg | ORAL_TABLET | Freq: Once | ORAL | Status: AC | PRN
  Administered 2020-03-08: 5 mg via ORAL

## 2020-03-08 MED ORDER — CELECOXIB 200 MG PO CAPS: 200.0000 mg | ORAL_CAPSULE | Freq: Two times a day (BID) | ORAL | 0 refills | Status: AC

## 2020-03-08 MED ORDER — CELECOXIB 200 MG PO CAPS
ORAL_CAPSULE | ORAL | Status: AC
  Filled 2020-03-08: qty 1

## 2020-03-08 MED ORDER — ACETAMINOPHEN 500 MG PO TABS
ORAL_TABLET | ORAL | Status: AC
  Filled 2020-03-08: qty 2

## 2020-03-08 MED ORDER — GABAPENTIN 300 MG PO CAPS
300.0000 mg | ORAL_CAPSULE | Freq: Two times a day (BID) | ORAL | 0 refills | Status: DC
Start: 2020-03-08 — End: 2021-02-17

## 2020-03-08 MED ORDER — FENTANYL CITRATE (PF) 100 MCG/2ML IJ SOLN
INTRAMUSCULAR | Status: AC
  Filled 2020-03-08: qty 2

## 2020-03-08 MED ORDER — LACTATED RINGERS IV SOLN: INTRAVENOUS | Status: DC

## 2020-03-08 MED ORDER — LABETALOL HCL 5 MG/ML IV SOLN
INTRAVENOUS | Status: AC
  Filled 2020-03-08: qty 4

## 2020-03-08 MED ORDER — MIDAZOLAM HCL 2 MG/2ML IJ SOLN
2.0000 mg | Freq: Once | INTRAMUSCULAR | Status: AC
  Administered 2020-03-08: 2 mg via INTRAVENOUS

## 2020-03-08 MED ORDER — FENTANYL CITRATE (PF) 100 MCG/2ML IJ SOLN
25.0000 ug | INTRAMUSCULAR | Status: DC | PRN
  Administered 2020-03-08 (×3): 25 ug via INTRAVENOUS

## 2020-03-08 MED ORDER — CHLORHEXIDINE GLUCONATE 4 % EX LIQD: 60.0000 mL | Freq: Once | CUTANEOUS | Status: DC

## 2020-03-08 MED ORDER — LACTATED RINGERS IV SOLN
INTRAVENOUS | Status: DC
  Administered 2020-03-08: 1000 mL via INTRAVENOUS

## 2020-03-08 MED ORDER — CEFAZOLIN SODIUM-DEXTROSE 2-4 GM/100ML-% IV SOLN
INTRAVENOUS | Status: AC
  Filled 2020-03-08: qty 100

## 2020-03-08 MED ORDER — FENTANYL CITRATE (PF) 100 MCG/2ML IJ SOLN
100.0000 ug | Freq: Once | INTRAMUSCULAR | Status: AC
  Administered 2020-03-08: 100 ug via INTRAVENOUS

## 2020-03-08 MED ORDER — PROMETHAZINE HCL 25 MG/ML IJ SOLN: 6.2500 mg | INTRAMUSCULAR | Status: DC | PRN

## 2020-03-08 MED ORDER — LABETALOL HCL 5 MG/ML IV SOLN
INTRAVENOUS | Status: DC | PRN
Start: 2020-03-08 — End: 2020-03-08
  Administered 2020-03-08: 5 mg via INTRAVENOUS

## 2020-03-08 SURGICAL SUPPLY — 82 items
APL PRP STRL LF DISP 70% ISPRP (MISCELLANEOUS) ×1
BIT DRILL 2.6 (BIT) ×1 IMPLANT
BIT DRILL OVERDRILL 3.5X122 (BIT) ×1 IMPLANT
BLADE CLIPPER SENSICLIP SURGIC (BLADE) ×1 IMPLANT
BLADE SURG 15 STRL LF DISP TIS (BLADE) ×1 IMPLANT
BLADE SURG 15 STRL SS (BLADE) ×2
BNDG CMPR 9X4 STRL LF SNTH (GAUZE/BANDAGES/DRESSINGS) ×1
BNDG COHESIVE 4X5 TAN STRL (GAUZE/BANDAGES/DRESSINGS) ×1 IMPLANT
BNDG ELASTIC 4X5.8 VLCR STR LF (GAUZE/BANDAGES/DRESSINGS) ×2 IMPLANT
BNDG ELASTIC 6X5.8 VLCR STR LF (GAUZE/BANDAGES/DRESSINGS) ×1 IMPLANT
BNDG ESMARK 4X9 LF (GAUZE/BANDAGES/DRESSINGS) ×2 IMPLANT
BNDG GAUZE ELAST 4 BULKY (GAUZE/BANDAGES/DRESSINGS) ×1 IMPLANT
CHLORAPREP W/TINT 26 (MISCELLANEOUS) ×2 IMPLANT
CLSR STERI-STRIP ANTIMIC 1/2X4 (GAUZE/BANDAGES/DRESSINGS) ×2 IMPLANT
CORD BIPOLAR FORCEPS 12FT (ELECTRODE) IMPLANT
COVER BACK TABLE 60X90IN (DRAPES) IMPLANT
COVER WAND RF STERILE (DRAPES) ×2 IMPLANT
CUFF TOURN SGL QUICK 18X4 (TOURNIQUET CUFF) IMPLANT
CUFF TOURN SGL QUICK 24 (TOURNIQUET CUFF) ×2
CUFF TRNQT CYL 24X4X16.5-23 (TOURNIQUET CUFF) IMPLANT
DRAPE EXTREMITY T 121X128X90 (DISPOSABLE) ×2 IMPLANT
DRAPE IMP U-DRAPE 54X76 (DRAPES) ×2 IMPLANT
DRAPE OEC MINIVIEW 54X84 (DRAPES) ×2 IMPLANT
DRAPE U-SHAPE 47X51 STRL (DRAPES) ×2 IMPLANT
DRSG ADAPTIC 3X8 NADH LF (GAUZE/BANDAGES/DRESSINGS) ×1 IMPLANT
DRSG EMULSION OIL 3X3 NADH (GAUZE/BANDAGES/DRESSINGS) IMPLANT
DRSG MEPILEX BORDER 4X8 (GAUZE/BANDAGES/DRESSINGS) ×1 IMPLANT
ELECT REM PT RETURN 9FT ADLT (ELECTROSURGICAL) ×2
ELECTRODE REM PT RTRN 9FT ADLT (ELECTROSURGICAL) ×1 IMPLANT
GAUZE SPONGE 4X4 12PLY STRL (GAUZE/BANDAGES/DRESSINGS) ×2 IMPLANT
GAUZE XEROFORM 1X8 LF (GAUZE/BANDAGES/DRESSINGS) IMPLANT
GLOVE BIO SURGEON STRL SZ7.5 (GLOVE) ×4 IMPLANT
GLOVE BIOGEL PI IND STRL 8 (GLOVE) ×2 IMPLANT
GLOVE BIOGEL PI INDICATOR 8 (GLOVE) ×2
GOWN STRL REUS W/TWL XL LVL3 (GOWN DISPOSABLE) ×4 IMPLANT
NDL HYPO 25X1 1.5 SAFETY (NEEDLE) IMPLANT
NEEDLE HYPO 25X1 1.5 SAFETY (NEEDLE) IMPLANT
NS IRRIG 1000ML POUR BTL (IV SOLUTION) ×2 IMPLANT
PACK DSU ARTHROSCOPY (CUSTOM PROCEDURE TRAY) ×2 IMPLANT
PAD CAST 4YDX4 CTTN HI CHSV (CAST SUPPLIES) ×1 IMPLANT
PADDING CAST ABS 4INX4YD NS (CAST SUPPLIES) ×1
PADDING CAST ABS COTTON 4X4 ST (CAST SUPPLIES) ×1 IMPLANT
PADDING CAST COTTON 4X4 STRL (CAST SUPPLIES) ×2
PADDING CAST COTTON 6X4 STRL (CAST SUPPLIES) ×1 IMPLANT
PASSER SUT SWANSON 36MM LOOP (INSTRUMENTS) IMPLANT
PENCIL BUTTON HOLSTER BLD 10FT (ELECTRODE) ×2 IMPLANT
PLATE COMP STRT NARROW 8H (Plate) ×1 IMPLANT
SCREW BONE 3.5X20MM (Screw) ×1 IMPLANT
SCREW BONE 3.5X24MM (Screw) ×2 IMPLANT
SCREW BONE 3.5X26MM (Screw) ×2 IMPLANT
SCREW LOCKING 20MM (Screw) ×1 IMPLANT
SCREW NON LOCKING 22MM (Screw) ×3 IMPLANT
SET BASIN DAY SURGERY F.S. (CUSTOM PROCEDURE TRAY) ×2 IMPLANT
SLING ARM FOAM STRAP LRG (SOFTGOODS) IMPLANT
SLING ARM FOAM STRAP MED (SOFTGOODS) IMPLANT
SLING ARM FOAM STRAP XLG (SOFTGOODS) IMPLANT
SLING ARM IMMOBILIZER LRG (SOFTGOODS) IMPLANT
SLING ARM IMMOBILIZER MED (SOFTGOODS) IMPLANT
SPLINT FAST PLASTER 5X30 (CAST SUPPLIES) ×10
SPLINT PLASTER CAST FAST 5X30 (CAST SUPPLIES) ×10 IMPLANT
SPONGE LAP 18X18 RF (DISPOSABLE) ×2 IMPLANT
STAPLER VISISTAT 35W (STAPLE) IMPLANT
SUCTION FRAZIER HANDLE 10FR (MISCELLANEOUS) ×2
SUCTION TUBE FRAZIER 10FR DISP (MISCELLANEOUS) ×1 IMPLANT
SUT ETHILON 3 0 PS 1 (SUTURE) ×2 IMPLANT
SUT FIBERWIRE #2 38 T-5 BLUE (SUTURE)
SUT MNCRL AB 4-0 PS2 18 (SUTURE) IMPLANT
SUT PROLENE 3 0 PS 2 (SUTURE) IMPLANT
SUT VIC AB 0 CT1 27 (SUTURE) ×2
SUT VIC AB 0 CT1 27XBRD ANBCTR (SUTURE) ×1 IMPLANT
SUT VIC AB 0 SH 27 (SUTURE) IMPLANT
SUT VIC AB 2-0 SH 27 (SUTURE)
SUT VIC AB 2-0 SH 27XBRD (SUTURE) IMPLANT
SUT VIC AB 3-0 SH 27 (SUTURE)
SUT VIC AB 3-0 SH 27X BRD (SUTURE) IMPLANT
SUTURE FIBERWR #2 38 T-5 BLUE (SUTURE) IMPLANT
SYR BULB EAR ULCER 3OZ GRN STR (SYRINGE) ×2 IMPLANT
SYR CONTROL 10ML LL (SYRINGE) IMPLANT
TOWEL OR 17X26 10 PK STRL BLUE (TOWEL DISPOSABLE) ×4 IMPLANT
TUBE CONNECTING 12X1/4 (SUCTIONS) ×1 IMPLANT
UNDERPAD 30X30 (UNDERPADS AND DIAPERS) ×2 IMPLANT
YANKAUER SUCT BULB TIP NO VENT (SUCTIONS) ×1 IMPLANT

## 2020-03-08 NOTE — Transfer of Care (Signed)
Last Vitals:  Vitals Value Taken Time  BP 152/95 03/08/20 1630  Temp 36.9 C 03/08/20 1624  Pulse 87 03/08/20 1630  Resp 17 03/08/20 1630  SpO2 100 % 03/08/20 1630  Vitals shown include unvalidated device data.  Last Pain:  Vitals:   03/08/20 1243  TempSrc: Oral  PainSc: 4       Patients Stated Pain Goal: 2 (03/08/20 1243)  Immediate Anesthesia Transfer of Care Note  Patient: Laura Gross  Procedure(s) Performed: Procedure(s) (LRB): OPEN REDUCTION INTERNAL FIXATION (ORIF) HUMERAL SHAFT FRACTURE (Left)  Patient Location: PACU  Anesthesia Type: General  Level of Consciousness: awake, alert  and oriented  Airway & Oxygen Therapy: Patient Spontanous Breathing and Patient connected to nasal cannula oxygen  Post-op Assessment: Report given to PACU RN and Post -op Vital signs reviewed and stable  Post vital signs: Reviewed and stable  Complications: No apparent anesthesia complications

## 2020-03-08 NOTE — H&P (Signed)
ORTHOPAEDIC CONSULTATION  REQUESTING PHYSICIAN: Renette Butters, MD  Chief Complaint: left hum fx  HPI: Laura Gross is a 34 y.o. female who complains of  Fall and Left hum fx  Past Medical History:  Diagnosis Date  . Chicken pox   . Family history of adverse reaction to anesthesia    ponv mother  . Headache   . History of UTI   . Hx of varicella   . Left supracondylar humerus fracture 03/04/2020  . Migraine    Past Surgical History:  Procedure Laterality Date  . APPENDECTOMY  yrs ago  . FEMUR SURGERY Left   . SPONTANEOUS HIP DISLOCATION REPAIR Left   . TONSILLECTOMY  2008 or 2009   Social History   Socioeconomic History  . Marital status: Married    Spouse name: Not on file  . Number of children: Not on file  . Years of education: Not on file  . Highest education level: Not on file  Occupational History  . Not on file  Tobacco Use  . Smoking status: Never Smoker  . Smokeless tobacco: Never Used  Substance and Sexual Activity  . Alcohol use: Yes    Comment: occ  . Drug use: No  . Sexual activity: Yes  Other Topics Concern  . Not on file  Social History Narrative  . Not on file   Social Determinants of Health   Financial Resource Strain:   . Difficulty of Paying Living Expenses:   Food Insecurity:   . Worried About Charity fundraiser in the Last Year:   . Arboriculturist in the Last Year:   Transportation Needs:   . Film/video editor (Medical):   Marland Kitchen Lack of Transportation (Non-Medical):   Physical Activity:   . Days of Exercise per Week:   . Minutes of Exercise per Session:   Stress:   . Feeling of Stress :   Social Connections:   . Frequency of Communication with Friends and Family:   . Frequency of Social Gatherings with Friends and Family:   . Attends Religious Services:   . Active Member of Clubs or Organizations:   . Attends Archivist Meetings:   Marland Kitchen Marital Status:    Family History  Problem Relation Age  of Onset  . Diabetes Paternal Grandmother   . Cancer Paternal Grandmother        lung  . Hyperlipidemia Mother   . Arthritis Mother   . Cancer Maternal Grandmother        breast  . Cancer Maternal Grandfather        nelanoma   Allergies  Allergen Reactions  . Penicillins Hives   Prior to Admission medications   Medication Sig Start Date End Date Taking? Authorizing Provider  ibuprofen (ADVIL) 200 MG tablet Take 200 mg by mouth every 6 (six) hours as needed. Takes 200 mg 2 tabs   Yes [provider]  oxyCODONE-acetaminophen (PERCOCET) 5-325 MG tablet Take 1 tablet by mouth every 8 (eight) hours as needed. 03/04/20  Yes Earleen Newport, MD   No results found.  Positive ROS: All other systems have been reviewed and were otherwise negative with the exception of those mentioned in the HPI and as above.  Labs cbc No results for input(s): WBC, HGB, HCT, PLT in the last 72 hours.  Labs inflam No results for input(s): CRP in the last 72 hours.  Invalid input(s): ESR  Labs coag No results for input(s):  INR, PTT in the last 72 hours.  Invalid input(s): PT  No results for input(s): NA, K, CL, CO2, GLUCOSE, BUN, CREATININE, CALCIUM in the last 72 hours.  Physical Exam: Vitals:   03/08/20 1325 03/08/20 1335  BP: 130/62 (!) 112/94  Pulse: 66 65  Resp: 12 13  Temp:    SpO2: 100% 100%   General: Alert, no acute distress Cardiovascular: No pedal edema Respiratory: No cyanosis, no use of accessory musculature GI: No organomegaly, abdomen is soft and non-tender Skin: No lesions in the area of chief complaint other than those listed below in MSK exam.  Neurologic: Sensation intact distally save for the below mentioned MSK exam Psychiatric: Patient is competent for consent with normal mood and affect Lymphatic: No axillary or cervical lymphadenopathy  MUSCULOSKELETAL:  LUE: NVI, some decreased Rad nerve sensation.  Other extremities are atraumatic with painless ROM  and NVI.  Assessment: Left humerus fracture  Plan: ORIF today.  I discussed the risks and she would like to proceed.    Renette Butters, MD    03/08/2020 2:06 PM

## 2020-03-08 NOTE — Anesthesia Procedure Notes (Signed)
Anesthesia Regional Block: Interscalene brachial plexus block   Pre-Anesthetic Checklist: ,, timeout performed, Correct Patient, Correct Site, Correct Laterality, Correct Procedure, Correct Position, site marked, Risks and benefits discussed,  Surgical consent,  Pre-op evaluation,  At surgeon's request and post-op pain management  Laterality: Left  Prep: chloraprep       Needles:  Injection technique: Single-shot  Needle Type: Echogenic Needle     Needle Length: 5cm  Needle Gauge: 21     Additional Needles:   Narrative:  Start time: 03/08/2020 1:31 PM End time: 03/08/2020 1:35 PM Injection made incrementally with aspirations every 5 mL.  Performed by: Personally  Anesthesiologist: Beryle Lathe, MD  Additional Notes: No pain on injection. No increased resistance to injection. Injection made in 5cc increments. Good needle visualization. Patient tolerated the procedure well.

## 2020-03-08 NOTE — Anesthesia Postprocedure Evaluation (Signed)
Anesthesia Post Note  Patient: Market researcher  Procedure(s) Performed: OPEN REDUCTION INTERNAL FIXATION (ORIF) HUMERAL SHAFT FRACTURE (Left Arm Upper)     Patient location during evaluation: PACU Anesthesia Type: Regional and General Level of consciousness: awake and alert Pain management: pain level controlled Vital Signs Assessment: post-procedure vital signs reviewed and stable Respiratory status: spontaneous breathing, nonlabored ventilation, respiratory function stable and patient connected to nasal cannula oxygen Cardiovascular status: blood pressure returned to baseline and stable Postop Assessment: no apparent nausea or vomiting Anesthetic complications: no    Last Vitals:  Vitals:   03/08/20 1720 03/08/20 1753  BP: 129/65 133/81  Pulse: 88 79  Resp: 16 16  Temp:  36.5 C  SpO2: 96% 95%    Last Pain:  Vitals:   03/08/20 1753  TempSrc: Oral  PainSc: 5                  Trevor Iha

## 2020-03-08 NOTE — Progress Notes (Signed)
AssistedDr. Brock with left, ultrasound guided, interscalene  block. Side rails up, monitors on throughout procedure. See vital signs in flow sheet. Tolerated Procedure well.  

## 2020-03-08 NOTE — Discharge Instructions (Signed)
Maintain sling until follow up.  You may take off sling when sitting to rest arm on pillows elevated above your heart to reduce swelling.  You are allowed to bend/straighten your elbow.  Diet: As you were doing prior to hospitalization   Dressing:  Keep ace wrap and dressings on and dry.    Activity:  Increase activity slowly as tolerated, but follow the weight bearing instructions below.  The rules on driving is that you can not be taking narcotics while you drive, and you must feel in control of the vehicle.    Weight Bearing:  Do not lift or bear weight with affected arm.  You may straighten and bend arm at the elbow.  Pain:  For severe pain, you may increase breakthrough pain medication (oxycodone) for the first few days post op to 2 tablets every 4 hours.  Stop this medication as soon as you are able.  Constipation: Narcotic pain medications cause constipation.  Reduce use or stop taking if you become constipated.  Drink plenty of fluids (prune juice may be helpful) and high fiber foods.  You may use a stool softener such as -  Colace (over the counter) 100 mg by mouth twice a day  And/or Miralax (over the counter) for constipation as needed.    Itching:  If you experience itching with your medications, try taking only a single pain pill, or even half a pain pill at a time.  You can also use benadryl over the counter for itching or also to help with sleep.   Precautions:  If you experience chest pain or shortness of breath - call 911 immediately for transfer to the hospital emergency department!!  If you develop a fever greater that 101 F, purulent drainage from wound, increased redness or drainage from wound, or calf pain -- Call the office at [phone]                                                 Follow- Up Appointment:  Please call for an appointment to be seen in 2 weeks [place] - [phone]  Regional Anesthesia Blocks  1. Numbness or the inability to move the  "blocked" extremity may last from 3-48 hours after placement. The length of time depends on the medication injected and your individual response to the medication. If the numbness is not going away after 48 hours, call your surgeon.  2. The extremity that is blocked will need to be protected until the numbness is gone and the  Strength has returned. Because you cannot feel it, you will need to take extra care to avoid injury. Because it may be weak, you may have difficulty moving it or using it. You may not know what position it is in without looking at it while the block is in effect.  3. For blocks in the legs and feet, returning to weight bearing and walking needs to be done carefully. You will need to wait until the numbness is entirely gone and the strength has returned. You should be able to move your leg and foot normally before you try and bear weight or walk. You will need someone to be with you when you first try to ensure you do not fall and possibly risk injury.  4. Bruising and tenderness at the needle site are common side effects and will  resolve in a few days.  5. Persistent numbness or new problems with movement should be communicated to the surgeon or the [place] [phone] [place] [phone]).   Post Anesthesia Home Care Instructions  Activity: Get plenty of rest for the remainder of the day. A responsible individual must stay with you for 24 hours following the procedure.  For the next 24 hours, DO NOT: -Drive a car -Paediatric nurse -Drink alcoholic beverages -Take any medication unless instructed by your physician -Make any legal decisions or sign important papers.  Meals: Start with liquid foods such as gelatin or soup. Progress to regular foods as tolerated. Avoid greasy, spicy, heavy foods. If nausea and/or vomiting occur, drink only clear liquids until the nausea and/or vomiting subsides. Call your physician if vomiting  continues.  Special Instructions/Symptoms: Your throat may feel dry or sore from the anesthesia or the breathing tube placed in your throat during surgery. If this causes discomfort, gargle with warm salt water. The discomfort should disappear within 24 hours.

## 2020-03-08 NOTE — Progress Notes (Signed)
Instructed that next dose of oxy will be 9:30 pm tonight if needed and written on discharge instructions medication times. Verbalized understanding.

## 2020-03-08 NOTE — Anesthesia Preprocedure Evaluation (Addendum)
Anesthesia Evaluation  Patient identified by MRN, date of birth, ID band Patient awake    Reviewed: Allergy & Precautions, NPO status , Patient's Chart, lab work & pertinent test results  History of Anesthesia Complications Negative for: history of anesthetic complications  Airway Mallampati: II  TM Distance: >3 FB Neck ROM: Full    Dental  (+) Dental Advisory Given, Chipped,    Pulmonary neg pulmonary ROS,    Pulmonary exam normal        Cardiovascular negative cardio ROS Normal cardiovascular exam     Neuro/Psych  Headaches, PSYCHIATRIC DISORDERS Anxiety    GI/Hepatic negative GI ROS, Neg liver ROS,   Endo/Other   Obesity   Renal/GU negative Renal ROS     Musculoskeletal negative musculoskeletal ROS (+)   Abdominal   Peds  Hematology negative hematology ROS (+)   Anesthesia Other Findings Covid neg 5/10  Reproductive/Obstetrics                            Anesthesia Physical Anesthesia Plan  ASA: II  Anesthesia Plan: General   Post-op Pain Management:  Regional for Post-op pain   Induction: Intravenous  PONV Risk Score and Plan: 3 and Treatment may vary due to age or medical condition, Ondansetron, Dexamethasone and Midazolam  Airway Management Planned: LMA  Additional Equipment: None  Intra-op Plan:   Post-operative Plan: Extubation in OR  Informed Consent: I have reviewed the patients History and Physical, chart, labs and discussed the procedure including the risks, benefits and alternatives for the proposed anesthesia with the patient or authorized representative who has indicated his/her understanding and acceptance.     Dental advisory given  Plan Discussed with: CRNA, Anesthesiologist and Surgeon  Anesthesia Plan Comments:        Anesthesia Quick Evaluation

## 2020-03-08 NOTE — Anesthesia Procedure Notes (Signed)
Procedure Name: LMA Insertion Date/Time: 03/08/2020 2:19 PM Performed by: Norva Pavlov, CRNA Pre-anesthesia Checklist: Patient identified, Emergency Drugs available, Suction available and Patient being monitored Patient Re-evaluated:Patient Re-evaluated prior to induction Oxygen Delivery Method: Circle system utilized Preoxygenation: Pre-oxygenation with 100% oxygen Induction Type: IV induction Ventilation: Mask ventilation without difficulty LMA: LMA inserted LMA Size: 4.0 Number of attempts: 1 Airway Equipment and Method: Bite block Placement Confirmation: positive ETCO2 Tube secured with: Tape Dental Injury: Teeth and Oropharynx as per pre-operative assessment

## 2020-03-09 NOTE — Op Note (Signed)
03/08/2020  5:34 PM  PATIENT:  Laura Gross    PRE-OPERATIVE DIAGNOSIS:  LEFT HUMERAL SHAFT FRACTURE  POST-OPERATIVE DIAGNOSIS:  Same  PROCEDURE:  OPEN REDUCTION INTERNAL FIXATION (ORIF) HUMERAL SHAFT FRACTURE  SURGEON:  Sheral Apley, MD  ASSISTANT: Aquilla Hacker, PA-C, he was present and scrubbed throughout the case, critical for completion in a timely fashion, and for retraction, instrumentation, and closure.   ANESTHESIA:   gen  PREOPERATIVE INDICATIONS:  [name] is a  34 y.o. female with a diagnosis of LEFT HUMERAL SHAFT FRACTURE who failed conservative measures and elected for surgical management.    The risks benefits and alternatives were discussed with the patient preoperatively including but not limited to the risks of infection, bleeding, nerve injury, cardiopulmonary complications, the need for revision surgery, among others, and the patient was willing to proceed.  OPERATIVE IMPLANTS: stryker locking plate  OPERATIVE FINDINGS: unstable fracture  BLOOD LOSS: 150  COMPLICATIONS: none  TOURNIQUET TIME: none  OPERATIVE PROCEDURE:  Patient was identified in the preoperative holding area and site was marked by me She was transported to the operating theater and placed on the table in supine position taking care to pad all bony prominences. After a preincinduction time out anesthesia was induced. The left upper extremity was prepped and draped in normal sterile fashion and a pre-incision timeout was performed. She received ancef for preoperative antibiotics.   I made an anterior incision directly over her fracture site.  I dissected around the biceps muscle protecting all neurovascular structures.  Brachialis had already been penetrated by the fracture itself I exposed the fracture fracture ends confirming no entrapment of her radial nerve I then reduced these and clamped in place.  I placed a lag screw across the fracture  Next I placed an 8 hole  plate on the anterior cortex of the humerus was able to get 8 solid screws in this.  I took multiple x-rays of the humerus and was happy with the alignment and placement of all hardware incision was then thoroughly irrigated closed this in layers sterile dressing was applied she was awoken and taken the PACU in stable condition  POST OPERATIVE PLAN: Mobilize for DVT prophylaxis sling full-time

## 2020-03-16 DIAGNOSIS — S42392D Other fracture of shaft of left humerus, subsequent encounter for fracture with routine healing: Secondary | ICD-10-CM | POA: Diagnosis not present

## 2020-04-27 DIAGNOSIS — S42392D Other fracture of shaft of left humerus, subsequent encounter for fracture with routine healing: Secondary | ICD-10-CM | POA: Diagnosis not present

## 2020-05-30 DIAGNOSIS — S42392D Other fracture of shaft of left humerus, subsequent encounter for fracture with routine healing: Secondary | ICD-10-CM | POA: Diagnosis not present

## 2020-06-02 DIAGNOSIS — M79602 Pain in left arm: Secondary | ICD-10-CM | POA: Diagnosis not present

## 2020-06-02 DIAGNOSIS — S42392D Other fracture of shaft of left humerus, subsequent encounter for fracture with routine healing: Secondary | ICD-10-CM | POA: Diagnosis not present

## 2020-06-02 DIAGNOSIS — Y9352 Activity, horseback riding: Secondary | ICD-10-CM | POA: Diagnosis not present

## 2020-06-10 DIAGNOSIS — M79602 Pain in left arm: Secondary | ICD-10-CM | POA: Diagnosis not present

## 2020-06-10 DIAGNOSIS — S42392D Other fracture of shaft of left humerus, subsequent encounter for fracture with routine healing: Secondary | ICD-10-CM | POA: Diagnosis not present

## 2020-06-10 DIAGNOSIS — Y9352 Activity, horseback riding: Secondary | ICD-10-CM | POA: Diagnosis not present

## 2020-06-13 DIAGNOSIS — M79602 Pain in left arm: Secondary | ICD-10-CM | POA: Diagnosis not present

## 2020-06-16 DIAGNOSIS — M79602 Pain in left arm: Secondary | ICD-10-CM | POA: Diagnosis not present

## 2020-06-16 DIAGNOSIS — S42392D Other fracture of shaft of left humerus, subsequent encounter for fracture with routine healing: Secondary | ICD-10-CM | POA: Diagnosis not present

## 2020-06-16 DIAGNOSIS — Y9352 Activity, horseback riding: Secondary | ICD-10-CM | POA: Diagnosis not present

## 2020-06-23 DIAGNOSIS — Y9352 Activity, horseback riding: Secondary | ICD-10-CM | POA: Diagnosis not present

## 2020-06-23 DIAGNOSIS — S42392D Other fracture of shaft of left humerus, subsequent encounter for fracture with routine healing: Secondary | ICD-10-CM | POA: Diagnosis not present

## 2020-06-23 DIAGNOSIS — M79602 Pain in left arm: Secondary | ICD-10-CM | POA: Diagnosis not present

## 2020-06-27 DIAGNOSIS — S42302K Unspecified fracture of shaft of humerus, left arm, subsequent encounter for fracture with nonunion: Secondary | ICD-10-CM | POA: Diagnosis not present

## 2020-06-30 DIAGNOSIS — S42392D Other fracture of shaft of left humerus, subsequent encounter for fracture with routine healing: Secondary | ICD-10-CM | POA: Diagnosis not present

## 2020-06-30 DIAGNOSIS — M79602 Pain in left arm: Secondary | ICD-10-CM | POA: Diagnosis not present

## 2020-06-30 DIAGNOSIS — Y9352 Activity, horseback riding: Secondary | ICD-10-CM | POA: Diagnosis not present

## 2020-07-06 ENCOUNTER — Other Ambulatory Visit: Payer: BC Managed Care – PPO

## 2020-07-07 DIAGNOSIS — N979 Female infertility, unspecified: Secondary | ICD-10-CM | POA: Diagnosis not present

## 2020-07-07 DIAGNOSIS — Y9352 Activity, horseback riding: Secondary | ICD-10-CM | POA: Diagnosis not present

## 2020-07-07 DIAGNOSIS — M79602 Pain in left arm: Secondary | ICD-10-CM | POA: Diagnosis not present

## 2020-07-07 DIAGNOSIS — S42392D Other fracture of shaft of left humerus, subsequent encounter for fracture with routine healing: Secondary | ICD-10-CM | POA: Diagnosis not present

## 2020-07-20 DIAGNOSIS — S42392D Other fracture of shaft of left humerus, subsequent encounter for fracture with routine healing: Secondary | ICD-10-CM | POA: Diagnosis not present

## 2020-07-20 DIAGNOSIS — M79602 Pain in left arm: Secondary | ICD-10-CM | POA: Diagnosis not present

## 2020-07-26 DIAGNOSIS — Y9352 Activity, horseback riding: Secondary | ICD-10-CM | POA: Diagnosis not present

## 2020-07-26 DIAGNOSIS — S42392D Other fracture of shaft of left humerus, subsequent encounter for fracture with routine healing: Secondary | ICD-10-CM | POA: Diagnosis not present

## 2020-07-26 DIAGNOSIS — M79602 Pain in left arm: Secondary | ICD-10-CM | POA: Diagnosis not present

## 2020-07-28 DIAGNOSIS — Y9352 Activity, horseback riding: Secondary | ICD-10-CM | POA: Diagnosis not present

## 2020-07-28 DIAGNOSIS — S42392D Other fracture of shaft of left humerus, subsequent encounter for fracture with routine healing: Secondary | ICD-10-CM | POA: Diagnosis not present

## 2020-07-28 DIAGNOSIS — M79602 Pain in left arm: Secondary | ICD-10-CM | POA: Diagnosis not present

## 2020-08-01 ENCOUNTER — Other Ambulatory Visit: Payer: Self-pay | Admitting: Obstetrics & Gynecology

## 2020-08-01 DIAGNOSIS — N979 Female infertility, unspecified: Secondary | ICD-10-CM

## 2020-08-02 DIAGNOSIS — S42392D Other fracture of shaft of left humerus, subsequent encounter for fracture with routine healing: Secondary | ICD-10-CM | POA: Diagnosis not present

## 2020-08-02 DIAGNOSIS — M79602 Pain in left arm: Secondary | ICD-10-CM | POA: Diagnosis not present

## 2020-08-02 DIAGNOSIS — Y9352 Activity, horseback riding: Secondary | ICD-10-CM | POA: Diagnosis not present

## 2020-08-04 ENCOUNTER — Ambulatory Visit
Admission: RE | Admit: 2020-08-04 | Discharge: 2020-08-04 | Disposition: A | Discharge status: Home / Self Care | Payer: BC Managed Care – PPO | Source: Ambulatory Visit | Attending: Obstetrics & Gynecology | Admitting: Obstetrics & Gynecology

## 2020-08-04 DIAGNOSIS — M79602 Pain in left arm: Secondary | ICD-10-CM | POA: Diagnosis not present

## 2020-08-04 DIAGNOSIS — Y9352 Activity, horseback riding: Secondary | ICD-10-CM | POA: Diagnosis not present

## 2020-08-04 DIAGNOSIS — N979 Female infertility, unspecified: Secondary | ICD-10-CM | POA: Diagnosis not present

## 2020-08-04 DIAGNOSIS — S42392D Other fracture of shaft of left humerus, subsequent encounter for fracture with routine healing: Secondary | ICD-10-CM | POA: Diagnosis not present

## 2020-08-09 DIAGNOSIS — M79602 Pain in left arm: Secondary | ICD-10-CM | POA: Diagnosis not present

## 2020-08-09 DIAGNOSIS — S42392D Other fracture of shaft of left humerus, subsequent encounter for fracture with routine healing: Secondary | ICD-10-CM | POA: Diagnosis not present

## 2020-08-17 DIAGNOSIS — M79602 Pain in left arm: Secondary | ICD-10-CM | POA: Diagnosis not present

## 2020-08-18 DIAGNOSIS — N979 Female infertility, unspecified: Secondary | ICD-10-CM | POA: Diagnosis not present

## 2020-09-06 DIAGNOSIS — N924 Excessive bleeding in the premenopausal period: Secondary | ICD-10-CM | POA: Diagnosis not present

## 2020-09-06 DIAGNOSIS — N979 Female infertility, unspecified: Secondary | ICD-10-CM | POA: Diagnosis not present

## 2020-09-06 DIAGNOSIS — N840 Polyp of corpus uteri: Secondary | ICD-10-CM | POA: Diagnosis not present

## 2020-09-15 DIAGNOSIS — N840 Polyp of corpus uteri: Secondary | ICD-10-CM | POA: Diagnosis not present

## 2020-09-15 LAB — HM PAP SMEAR

## 2020-09-30 DIAGNOSIS — Z20822 Contact with and (suspected) exposure to covid-19: Secondary | ICD-10-CM | POA: Diagnosis not present

## 2020-10-03 DIAGNOSIS — D509 Iron deficiency anemia, unspecified: Secondary | ICD-10-CM | POA: Diagnosis not present

## 2020-10-07 DIAGNOSIS — Z20822 Contact with and (suspected) exposure to covid-19: Secondary | ICD-10-CM | POA: Diagnosis not present

## 2020-10-13 DIAGNOSIS — N979 Female infertility, unspecified: Secondary | ICD-10-CM | POA: Diagnosis not present

## 2020-10-14 DIAGNOSIS — Z20822 Contact with and (suspected) exposure to covid-19: Secondary | ICD-10-CM | POA: Diagnosis not present

## 2020-10-17 DIAGNOSIS — N979 Female infertility, unspecified: Secondary | ICD-10-CM | POA: Diagnosis not present

## 2020-10-19 DIAGNOSIS — N979 Female infertility, unspecified: Secondary | ICD-10-CM | POA: Diagnosis not present

## 2020-10-23 DIAGNOSIS — Z20822 Contact with and (suspected) exposure to covid-19: Secondary | ICD-10-CM | POA: Diagnosis not present

## 2020-12-08 DIAGNOSIS — N979 Female infertility, unspecified: Secondary | ICD-10-CM | POA: Diagnosis not present

## 2020-12-12 DIAGNOSIS — N979 Female infertility, unspecified: Secondary | ICD-10-CM | POA: Diagnosis not present

## 2021-01-06 DIAGNOSIS — N979 Female infertility, unspecified: Secondary | ICD-10-CM | POA: Diagnosis not present

## 2021-01-10 DIAGNOSIS — N979 Female infertility, unspecified: Secondary | ICD-10-CM | POA: Diagnosis not present

## 2021-01-12 DIAGNOSIS — N979 Female infertility, unspecified: Secondary | ICD-10-CM | POA: Diagnosis not present

## 2021-01-14 DIAGNOSIS — N979 Female infertility, unspecified: Secondary | ICD-10-CM | POA: Diagnosis not present

## 2021-02-06 DIAGNOSIS — N979 Female infertility, unspecified: Secondary | ICD-10-CM | POA: Diagnosis not present

## 2021-02-17 ENCOUNTER — Ambulatory Visit (INDEPENDENT_AMBULATORY_CARE_PROVIDER_SITE_OTHER): Payer: BC Managed Care – PPO | Admitting: Primary Care

## 2021-02-17 ENCOUNTER — Encounter: Payer: Self-pay | Admitting: Primary Care

## 2021-02-17 ENCOUNTER — Other Ambulatory Visit: Payer: Self-pay

## 2021-02-17 VITALS — BP 136/82 | HR 62 | Temp 98.6°F | Ht 66.0 in | Wt 245.0 lb

## 2021-02-17 DIAGNOSIS — E669 Obesity, unspecified: Secondary | ICD-10-CM

## 2021-02-17 DIAGNOSIS — G43901 Migraine, unspecified, not intractable, with status migrainosus: Secondary | ICD-10-CM | POA: Diagnosis not present

## 2021-02-17 DIAGNOSIS — Z6839 Body mass index (BMI) 39.0-39.9, adult: Secondary | ICD-10-CM | POA: Diagnosis not present

## 2021-02-17 DIAGNOSIS — Z0000 Encounter for general adult medical examination without abnormal findings: Secondary | ICD-10-CM

## 2021-02-17 DIAGNOSIS — F411 Generalized anxiety disorder: Secondary | ICD-10-CM

## 2021-02-17 LAB — COMPREHENSIVE METABOLIC PANEL
ALT: 49 U/L — ABNORMAL HIGH (ref 0–35)
AST: 57 U/L — ABNORMAL HIGH (ref 0–37)
Albumin: 4.3 g/dL (ref 3.5–5.2)
Alkaline Phosphatase: 87 U/L (ref 39–117)
BUN: 5 mg/dL — ABNORMAL LOW (ref 6–23)
CO2: 26 mEq/L (ref 19–32)
Calcium: 9.5 mg/dL (ref 8.4–10.5)
Chloride: 104 mEq/L (ref 96–112)
Creatinine, Ser: 0.7 mg/dL (ref 0.40–1.20)
GFR: 113.56 mL/min (ref >60.00)
Glucose, Bld: 86 mg/dL (ref 70–99)
Potassium: 4.1 mEq/L (ref 3.5–5.1)
Sodium: 139 mEq/L (ref 135–145)
Total Bilirubin: 0.5 mg/dL (ref 0.2–1.2)
Total Protein: 7.4 g/dL (ref 6.0–8.3)

## 2021-02-17 LAB — CBC
HCT: 39.6 % (ref 36.0–46.0)
Hemoglobin: 13.4 g/dL (ref 12.0–15.0)
MCHC: 33.8 g/dL (ref 30.0–36.0)
MCV: 85.1 fl (ref 78.0–100.0)
Platelets: 228 10*3/uL (ref 150.0–400.0)
RBC: 4.66 Mil/uL (ref 3.87–5.11)
RDW: 12.7 % (ref 11.5–15.5)
WBC: 6.5 10*3/uL (ref 4.0–10.5)

## 2021-02-17 LAB — LIPID PANEL
Cholesterol: 118 mg/dL (ref 0–200)
HDL: 43.4 mg/dL (ref >39.00)
LDL Cholesterol: 57 mg/dL (ref 0–99)
NonHDL: 74.42
Total CHOL/HDL Ratio: 3
Triglycerides: 88 mg/dL (ref 0.0–149.0)
VLDL: 17.6 mg/dL (ref 0.0–40.0)

## 2021-02-17 LAB — HEMOGLOBIN A1C: Hgb A1c MFr Bld: 5.1 % (ref 4.6–6.5)

## 2021-02-17 NOTE — Assessment & Plan Note (Signed)
Denies concerns for anxiety, continue to monitor.

## 2021-02-17 NOTE — Assessment & Plan Note (Signed)
Denies concerns today, no recent migraine.

## 2021-02-17 NOTE — Assessment & Plan Note (Signed)
Failed numerous diet plans as mentioned in HPI. Referral placed for bariatric surgery per patient request.

## 2021-02-17 NOTE — Progress Notes (Signed)
Subjective:    Patient ID: Laura Gross, female    DOB: [birth year], 35 y.o.   MRN: [id number]  HPI  Laura Gross is a very pleasant 35 y.o. female who presents today for complete physical.  She is requesting a referral for bariatric surgery. She's tried dieting with keto, atkins, low calorie, weight watchers, eating vegetables without improvement. She's also completed a prescription of phentermine, lost some weight but it returned once she completed the regimen. She denies eating junk food, desserts, candy. She drinks water only. She has a strong family history obesity who have undergone bariatric surgery.   Immunizations: -Tetanus: 2001 -Influenza: Did not receive -Covid-19: Did not receive   Diet: She endorses a healthy diet Exercise: Walking some  Eye exam: Completes annually  Dental exam: Completes semi-annually   Pap Smear: UTD, follows with GYN  BP Readings from Last 3 Encounters:  02/17/21 136/82  03/08/20 133/81  03/04/20 139/67        Review of Systems  Constitutional: Negative for unexpected weight change.  HENT: Negative for rhinorrhea.   Eyes: Negative for visual disturbance.  Respiratory: Negative for shortness of breath.   Cardiovascular: Negative for chest pain.  Gastrointestinal: Negative for constipation and diarrhea.  Genitourinary: Negative for difficulty urinating and menstrual problem.  Musculoskeletal: Negative for arthralgias.  Skin: Negative for rash.  Allergic/Immunologic: Negative for environmental allergies.  Neurological: Negative for dizziness and headaches.  Psychiatric/Behavioral: The patient is not nervous/anxious.          Past Medical History:  Diagnosis Date  . Chicken pox   . Family history of adverse reaction to anesthesia    ponv mother  . Headache   . History of UTI   . Hx of varicella   . Left supracondylar humerus fracture 03/04/2020  . Migraine     Social History   Socioeconomic History  .  Marital status: Married    Spouse name: Not on file  . Number of children: Not on file  . Years of education: Not on file  . Highest education level: Not on file  Occupational History  . Not on file  Tobacco Use  . Smoking status: Never Smoker  . Smokeless tobacco: Never Used  Vaping Use  . Vaping Use: Never used  Substance and Sexual Activity  . Alcohol use: Yes    Comment: occ  . Drug use: No  . Sexual activity: Yes  Other Topics Concern  . Not on file  Social History Narrative  . Not on file   Social Determinants of Health   Financial Resource Strain: Not on file  Food Insecurity: Not on file  Transportation Needs: Not on file  Physical Activity: Not on file  Stress: Not on file  Social Connections: Not on file  Intimate Partner Violence: Not on file    Past Surgical History:  Procedure Laterality Date  . APPENDECTOMY  yrs ago  . FEMUR SURGERY Left   . ORIF HUMERUS FRACTURE Left 03/08/2020   Procedure: OPEN REDUCTION INTERNAL FIXATION (ORIF) HUMERAL SHAFT FRACTURE;  Surgeon: Sheral Apley, MD;  Location: [facility];  Service: Orthopedics;  Laterality: Left;  . SPONTANEOUS HIP DISLOCATION REPAIR Left   . TONSILLECTOMY  2008 or 2009    Family History  Problem Relation Age of Onset  . Diabetes Paternal Grandmother   . Cancer Paternal Grandmother        lung  . Hyperlipidemia Mother   . Arthritis Mother   . Cancer Maternal  Grandmother        breast  . Cancer Maternal Grandfather        nelanoma    Allergies  Allergen Reactions  . Penicillins Hives    No current outpatient medications on file prior to visit.   No current facility-administered medications on file prior to visit.    BP 136/82   Pulse 62   Temp 98.6 F (37 C) (Temporal)   Ht 5\' 6"  (1.676 m)   Wt 245 lb (111.1 kg)   SpO2 98%   BMI 39.54 kg/m  Objective:   Physical Exam HENT:     Right Ear: Tympanic membrane and ear canal normal.     Left Ear: Tympanic  membrane and ear canal normal.     Nose: Nose normal.  Eyes:     Conjunctiva/sclera: Conjunctivae normal.     Pupils: Pupils are equal, round, and reactive to light.  Neck:     Thyroid: No thyromegaly.  Cardiovascular:     Rate and Rhythm: Normal rate and regular rhythm.     Heart sounds: No murmur heard.   Pulmonary:     Effort: Pulmonary effort is normal.     Breath sounds: Normal breath sounds. No rales.  Abdominal:     General: Bowel sounds are normal.     Palpations: Abdomen is soft.     Tenderness: There is no abdominal tenderness.  Musculoskeletal:        General: Normal range of motion.     Cervical back: Neck supple.  Lymphadenopathy:     Cervical: No cervical adenopathy.  Skin:    General: Skin is warm and dry.     Findings: No rash.  Neurological:     Mental Status: She is alert and oriented to person, place, and time.     Cranial Nerves: No cranial nerve deficit.     Deep Tendon Reflexes: Reflexes are normal and symmetric.  Psychiatric:        Mood and Affect: Mood normal.           Assessment & Plan:      This visit occurred during the SARS-CoV-2 public health emergency.  Safety protocols were in place, including screening questions prior to the visit, additional usage of staff PPE, and extensive cleaning of exam room while observing appropriate contact time as indicated for disinfecting solutions.

## 2021-02-17 NOTE — Patient Instructions (Signed)
Stop by the lab prior to leaving today. I will notify you of your results once received.   You will be contacted regarding your referral to bariatric surgery.  Please let us know if you have not been contacted within two weeks.   It was a pleasure to see you today!   Preventive Care 35-34 Years Old, Female Preventive care refers to lifestyle choices and visits with your health care provider that can promote health and wellness. This includes:  A yearly physical exam. This is also called an annual wellness visit.  Regular dental and eye exams.  Immunizations.  Screening for certain conditions.  Healthy lifestyle choices, such as: ? Eating a healthy diet. ? Getting regular exercise. ? Not using drugs or products that contain nicotine and tobacco. ? Limiting alcohol use. What can I expect for my preventive care visit? Physical exam Your health care provider may check your:  Height and weight. These may be used to calculate your BMI (body mass index). BMI is a measurement that tells if you are at a healthy weight.  Heart rate and blood pressure.  Body temperature.  Skin for abnormal spots. Counseling Your health care provider may ask you questions about your:  Past medical problems.  Family's medical history.  Alcohol, tobacco, and drug use.  Emotional well-being.  Home life and relationship well-being.  Sexual activity.  Diet, exercise, and sleep habits.  Work and work Statistician.  Access to firearms.  Method of birth control.  Menstrual cycle.  Pregnancy history. What immunizations do I need? Vaccines are usually given at various ages, according to a schedule. Your health care provider will recommend vaccines for you based on your age, medical history, and lifestyle or other factors, such as travel or where you work.   What tests do I need? Blood tests  Lipid and cholesterol levels. These may be checked every 5 years starting at age 72.  Hepatitis C  test.  Hepatitis B test. Screening  Diabetes screening. This is done by checking your blood sugar (glucose) after you have not eaten for a while (fasting).  STD (sexually transmitted disease) testing, if you are at risk.  BRCA-related cancer screening. This may be done if you have a family history of breast, ovarian, tubal, or peritoneal cancers.  Pelvic exam and Pap test. This may be done every 3 years starting at age 44. Starting at age 25, this may be done every 5 years if you have a Pap test in combination with an HPV test. Talk with your health care provider about your test results, treatment options, and if necessary, the need for more tests.   Follow these instructions at home: Eating and drinking  Eat a healthy diet that includes fresh fruits and vegetables, whole grains, lean protein, and low-fat dairy products.  Take vitamin and mineral supplements as recommended by your health care provider.  Do not drink alcohol if: ? Your health care provider tells you not to drink. ? You are pregnant, may be pregnant, or are planning to become pregnant.  If you drink alcohol: ? Limit how much you have to 0-1 drink a day. ? Be aware of how much alcohol is in your drink. In the [place] one drink equals one 12 oz bottle of beer (355 mL), one 5 oz glass of wine (148 mL), or one 1 oz glass of hard liquor (44 mL).   Lifestyle  Take daily care of your teeth and gums. Brush your teeth every morning and  night with fluoride toothpaste. Floss one time each day.  Stay active. Exercise for at least 30 minutes 5 or more days each week.  Do not use any products that contain nicotine or tobacco, such as cigarettes, e-cigarettes, and chewing tobacco. If you need help quitting, ask your health care provider.  Do not use drugs.  If you are sexually active, practice safe sex. Use a condom or other form of protection to prevent STIs (sexually transmitted infections).  If you do not wish to become  pregnant, use a form of birth control. If you plan to become pregnant, see your health care provider for a prepregnancy visit.  Find healthy ways to cope with stress, such as: ? Meditation, yoga, or listening to music. ? Journaling. ? Talking to a trusted person. ? Spending time with friends and family. Safety  Always wear your seat belt while driving or riding in a vehicle.  Do not drive: ? If you have been drinking alcohol. Do not ride with someone who has been drinking. ? When you are tired or distracted. ? While texting.  Wear a helmet and other protective equipment during sports activities.  If you have firearms in your house, make sure you follow all gun safety procedures.  Seek help if you have been physically or sexually abused. What's next?  Go to your health care provider once a year for an annual wellness visit.  Ask your health care provider how often you should have your eyes and teeth checked.  Stay up to date on all vaccines. This information is not intended to replace advice given to you by your health care provider. Make sure you discuss any questions you have with your health care provider. Document Revised: 06/12/2020 Document Reviewed: 06/26/2018 Elsevier Patient Education  2021 [employer].

## 2021-02-17 NOTE — Assessment & Plan Note (Signed)
Declines tetanus. Has not completed Covid vaccines. Pap smear UTD, follows with GYN.  Discussed the importance of a healthy diet and regular exercise in order for weight loss, and to reduce the risk of any potential medical problems.  Exam today stable. Labs pending

## 2021-02-27 ENCOUNTER — Encounter: Payer: Self-pay | Admitting: Primary Care

## 2021-03-09 ENCOUNTER — Other Ambulatory Visit: Payer: Self-pay | Admitting: Surgery

## 2021-03-09 ENCOUNTER — Other Ambulatory Visit (HOSPITAL_COMMUNITY): Payer: Self-pay | Admitting: Surgery

## 2021-03-23 ENCOUNTER — Ambulatory Visit (HOSPITAL_COMMUNITY)
Admission: RE | Admit: 2021-03-23 | Discharge: 2021-03-23 | Disposition: A | Discharge status: Home / Self Care | Payer: BC Managed Care – PPO | Source: Ambulatory Visit | Attending: Surgery | Admitting: Surgery

## 2021-03-23 ENCOUNTER — Ambulatory Visit: Payer: BC Managed Care – PPO | Admitting: Primary Care

## 2021-03-23 ENCOUNTER — Other Ambulatory Visit (HOSPITAL_COMMUNITY): Payer: Self-pay | Admitting: Surgery

## 2021-03-23 ENCOUNTER — Other Ambulatory Visit: Payer: Self-pay

## 2021-03-23 ENCOUNTER — Encounter: Payer: Self-pay | Admitting: Primary Care

## 2021-03-23 DIAGNOSIS — E669 Obesity, unspecified: Secondary | ICD-10-CM | POA: Diagnosis not present

## 2021-03-23 DIAGNOSIS — Z01818 Encounter for other preprocedural examination: Secondary | ICD-10-CM | POA: Diagnosis not present

## 2021-03-23 DIAGNOSIS — Z6839 Body mass index (BMI) 39.0-39.9, adult: Secondary | ICD-10-CM | POA: Diagnosis not present

## 2021-03-23 DIAGNOSIS — K224 Dyskinesia of esophagus: Secondary | ICD-10-CM | POA: Diagnosis not present

## 2021-03-23 NOTE — Progress Notes (Signed)
Subjective:    Patient ID: Laura Gross, female    DOB: May 26, 1986, 35 y.o.   MRN: [id number]  HPI  Laura Gross is a very pleasant 35 y.o. female with a history of obesity who presents today to discuss weight management.   She was last evaluated in April 2022 for CPE, requested a referral for bariatric surgery. She had tried many diets including Atkins, Keto, low calorie, weight watchers, eating vegetables without improvement. She was once on phentermine which was temporarily effective. Referral was placed for bariatric surgery.  Today she is needing four months of monitored weight loss with PCP.  Last visit was her first visit, this will be her second. She is a candidate for the Gastric Sleeve.   Since her last visit she's been watching her diet. She is walking and swimming with her kids. Her daughter will be out of school for the Summer so she can increase her physical activity.   Diet currently consists of:  Breakfast: Iced coffee (heavy cream), scrambled egg Lunch: Leftovers, salad with protein Dinner: Meat, two vegetables  Snacks: Not snacking  Desserts: Once weekly, cookie Beverages: Water, occasionally diet soda, iced coffee  Exercise: Walking, swimming   Wt Readings from Last 3 Encounters:  03/23/21 246 lb (111.6 kg)  02/17/21 245 lb (111.1 kg)  03/08/20 245 lb (111.1 kg)     Review of Systems  Respiratory: Negative for shortness of breath.   Cardiovascular: Negative for chest pain.  Musculoskeletal: Positive for arthralgias.  Neurological: Negative for dizziness.         Past Medical History:  Diagnosis Date  . Chicken pox   . Family history of adverse reaction to anesthesia    ponv mother  . Headache   . History of UTI   . Hx of varicella   . Left supracondylar humerus fracture 03/04/2020  . Migraine     Social History   Socioeconomic History  . Marital status: Married    Spouse name: Not on file  . Number of children: Not on  file  . Years of education: Not on file  . Highest education level: Not on file  Occupational History  . Not on file  Tobacco Use  . Smoking status: Never Smoker  . Smokeless tobacco: Never Used  Vaping Use  . Vaping Use: Never used  Substance and Sexual Activity  . Alcohol use: Yes    Comment: occ  . Drug use: No  . Sexual activity: Yes  Other Topics Concern  . Not on file  Social History Narrative  . Not on file   Social Determinants of Health   Financial Resource Strain: Not on file  Food Insecurity: Not on file  Transportation Needs: Not on file  Physical Activity: Not on file  Stress: Not on file  Social Connections: Not on file  Intimate Partner Violence: Not on file    Past Surgical History:  Procedure Laterality Date  . APPENDECTOMY  yrs ago  . FEMUR SURGERY Left   . ORIF HUMERUS FRACTURE Left 03/08/2020   Procedure: OPEN REDUCTION INTERNAL FIXATION (ORIF) HUMERAL SHAFT FRACTURE;  Surgeon: Sheral Apley, MD;  Location: [facility];  Service: Orthopedics;  Laterality: Left;  . SPONTANEOUS HIP DISLOCATION REPAIR Left   . TONSILLECTOMY  2008 or 2009    Family History  Problem Relation Age of Onset  . Diabetes Paternal Grandmother   . Cancer Paternal Grandmother        lung  . Hyperlipidemia  Mother   . Arthritis Mother   . Cancer Maternal Grandmother        breast  . Cancer Maternal Grandfather        nelanoma    Allergies  Allergen Reactions  . Penicillins Hives    No current outpatient medications on file prior to visit.   No current facility-administered medications on file prior to visit.    BP 124/84   Pulse 72   Temp 97.7 F (36.5 C) (Temporal)   Ht 5\' 6"  (1.676 m)   Wt 246 lb (111.6 kg)   LMP 03/03/2021   SpO2 98%   BMI 39.71 kg/m  Objective:   Physical Exam Cardiovascular:     Rate and Rhythm: Normal rate and regular rhythm.  Pulmonary:     Effort: Pulmonary effort is normal.     Breath sounds: Normal breath  sounds.  Musculoskeletal:     Cervical back: Neck supple.  Skin:    General: Skin is warm and dry.           Assessment & Plan:      This visit occurred during the SARS-CoV-2 public health emergency.  Safety protocols were in place, including screening questions prior to the visit, additional usage of staff PPE, and extensive cleaning of exam room while observing appropriate contact time as indicated for disinfecting solutions.

## 2021-03-23 NOTE — Assessment & Plan Note (Signed)
Second visit for weight management.  Her diet appears very healthy, recommended she increase physical activity, she agrees.   Exam today stable. Labs reviewed from last visit. Follow up in 1 month for 3rd visit.

## 2021-03-23 NOTE — Patient Instructions (Signed)
Continue exercising. You should be getting 150 minutes of moderate intensity exercise weekly.  Continue to work on a healthy diet.   Please schedule a follow up appointment for 1 month.  It was a pleasure to see you today!

## 2021-04-10 ENCOUNTER — Encounter: Payer: BC Managed Care – PPO | Attending: Surgery | Admitting: Dietician

## 2021-04-10 ENCOUNTER — Encounter: Payer: Self-pay | Admitting: Dietician

## 2021-04-10 ENCOUNTER — Other Ambulatory Visit: Payer: Self-pay

## 2021-04-10 VITALS — Ht 66.0 in | Wt 243.1 lb

## 2021-04-10 DIAGNOSIS — Z6839 Body mass index (BMI) 39.0-39.9, adult: Secondary | ICD-10-CM | POA: Diagnosis not present

## 2021-04-10 DIAGNOSIS — E669 Obesity, unspecified: Secondary | ICD-10-CM

## 2021-04-10 NOTE — Progress Notes (Signed)
Nutrition Assessment for Bariatric Surgery   Appointment start time: 1530   end time: 1630  Planned Surgery: Sleeve gastrectomy  Anthropometrics: Weight: 243.1lbs Height: 5'6" BMI: 39.24   Patient's Goal Weight:   Clinical: Medical History: none significant Medications and Supplements: occasional elderberry  Relevant labs: blood lipids, HbA1C within normal limits as of 02/17/21 Notable symptoms: no symptoms Drug allergies: penicillins Food allergies: none  Lifestyle and Dietary History:  Dieting/ weight history:  Patient has tried various strategies to lose weight including phentermine, Atkins, Keto, and exercise ie boot camp (3 months) with very limited success, usually 5-10lbs weight loss with regain. Reports normal weight during childhood and early adulthood, about 145lbs during college, gained to 44 after marriage, then more significant gain after pregnancy. Has been unable to lose much below 239lbs since then.   Disordered or emotional eating history: no eating disorder or emotional eataing  Physical activity: walking/ swimming 30-45 minutes, 2-3 times a week  Dietary Recall:  Daily pattern: 2-3 meals and 1-2 snacks. Dining out: 0-4 meals per week. Breakfast: iced coffee, sometimes [employer] egg bites, usually not hungry  Snack: none Lunch: 11am -- leftovers; salad with chicken; tomato, mozzarella, olives; occ healthy frozen meal; occ just fruit ie watermelon Snack: fruit; veg; cheese crisps; does not like many sweets Supper: meat + vegetables, sometimes corn/ potato -- usually broccoli, cauliflower Snack: sugar free pudding Beverages: water, sugar free drinks   Nutrition Intervention: Instructed patient on pre-op diet goals and importance of close adherence to bariatric diet after surgery to avoid side effects and complications.  Discussed stages of the bariatric diet after surgery as well as the importance of adequate protein and fluid intake.  Discussed potential  challenges of holidays, group gatherings and events after having weight loss surgery, and importance of preparing emotionally for such challenges.   Nutrition Diagnosis: [place]-3.3 Overweight/ obesity related to history of excess calories and inadequate physical activity as evidenced by patient with current BMI of 39.24, following dietary guidelines for weight loss prior to bariatric surgery.  Teaching method(s) utilized: Risk manager provided: Pre-op Goals Diet Stage Template   Learning Readiness: Change in progress  Barriers to learning/ implementing lifestyle change: none  Demonstrated degree of understanding via: Teach Back  Summary: Patient has begun making diet and lifestyle changes in effort to lose weight and prepare for bariatric surgery. She is meeting with her physician for supervised weight loss visits. Patient's goal for weight loss is reduction of health risk. She has solid support from family. Both parents have had bariatric surgery and have been successful with weight loss. She is participating in bariatric support group meetings. She agrees to work on decreasing fluid intake during meals, and maintaining healthy eating pattern prior to surgery.  She voices understanding of, and is motivated to follow the bariatric diet after surgery.  From a nutrition standpoint, she is ready to proceed with the bariatric surgery program and will be a good candidate for surgery.   Plan: Patient commits to returning for pre-op class visit prior to surgery.  She will plan to return for post-op MNT visits beginning 2 weeks after surgery.

## 2021-04-10 NOTE — Patient Instructions (Addendum)
Continue with current eating pattern, great job making healthy food choices! Gradually decrease fluid intake during meals by keeping glass/ cup away from table, using smaller glass, or pouring less fluid into the glass.

## 2021-04-25 ENCOUNTER — Ambulatory Visit: Payer: BC Managed Care – PPO | Admitting: Primary Care

## 2021-04-25 ENCOUNTER — Encounter: Payer: Self-pay | Admitting: Primary Care

## 2021-04-25 ENCOUNTER — Other Ambulatory Visit: Payer: Self-pay

## 2021-04-25 DIAGNOSIS — Z6839 Body mass index (BMI) 39.0-39.9, adult: Secondary | ICD-10-CM

## 2021-04-25 DIAGNOSIS — E669 Obesity, unspecified: Secondary | ICD-10-CM

## 2021-04-25 NOTE — Progress Notes (Signed)
Subjective:    Patient ID: Laura Gross, female    DOB: [birth year], 35 y.o.   MRN: [id number]  HPI  Laura Gross is a very pleasant 35 y.o. female with a history of obesity who presents today for follow up of obesity and weight management.   She is currently pending bariatric surgery and is required to undergo four months of weight management evaluations.   Today is her third appointment. She has failed several diets in the past including Atkins, Keto, low calorie, weight watchers. Since her last visit nothing has changed. She continues to work on her diet, has become more active recently. She does not weigh herself at home often.   Diet currently consists of:  Breakfast: Fruit, Iced coffee Lunch: Left overs, salad with protein  Dinner: Meat, vegetables, occasionally potatoes  Snacks: Guacamole with veggies  Desserts: Infrequently  Beverages: Water, Iced Coffee, diet soda  Exercise: Walking   She's had several family members undergo bariatric surgery who were very successful. She continues to experience chronic lower back and left hip pain, she knows that her weight is contributing.    Wt Readings from Last 3 Encounters:  04/25/21 246 lb 4 oz (111.7 kg)  04/10/21 243 lb 1.6 oz (110.3 kg)  03/23/21 246 lb (111.6 kg)        Review of Systems  Respiratory:  Negative for shortness of breath.   Cardiovascular:  Negative for chest pain.  Musculoskeletal:  Positive for arthralgias.        Past Medical History:  Diagnosis Date   Chicken pox    Family history of adverse reaction to anesthesia    ponv mother   Headache    History of UTI    Hx of varicella    Left supracondylar humerus fracture 03/04/2020   Migraine     Social History   Socioeconomic History   Marital status: Married    Spouse name: Not on file   Number of children: Not on file   Years of education: Not on file   Highest education level: Not on file  Occupational History    Not on file  Tobacco Use   Smoking status: Never   Smokeless tobacco: Never  Vaping Use   Vaping Use: Never used  Substance and Sexual Activity   Alcohol use: Yes    Comment: occasional   Drug use: No   Sexual activity: Yes  Other Topics Concern   Not on file  Social History Narrative   Not on file   Social Determinants of Health   Financial Resource Strain: Not on file  Food Insecurity: Not on file  Transportation Needs: Not on file  Physical Activity: Not on file  Stress: Not on file  Social Connections: Not on file  Intimate Partner Violence: Not on file    Past Surgical History:  Procedure Laterality Date   APPENDECTOMY  yrs ago   FEMUR SURGERY Left    ORIF HUMERUS FRACTURE Left 03/08/2020   Procedure: OPEN REDUCTION INTERNAL FIXATION (ORIF) HUMERAL SHAFT FRACTURE;  Surgeon: Sheral Apley, MD;  Location: [facility] [place];  Service: Orthopedics;  Laterality: Left;   SPONTANEOUS HIP DISLOCATION REPAIR Left    TONSILLECTOMY  2008 or 2009    Family History  Problem Relation Age of Onset   Diabetes Paternal Grandmother    Cancer Paternal Grandmother        lung   Hyperlipidemia Mother    Arthritis Mother    Cancer Maternal Grandmother  breast   Cancer Maternal Grandfather        nelanoma    Allergies  Allergen Reactions   Penicillins Hives    No current outpatient medications on file prior to visit.   No current facility-administered medications on file prior to visit.    BP 120/80 (BP Location: Left Arm, Patient Position: Sitting, Cuff Size: Large)   Pulse 63   Temp 98 F (36.7 C) (Temporal)   Ht 5\' 6"  (1.676 m)   Wt 246 lb 4 oz (111.7 kg)   LMP 03/29/2021 (Approximate)   SpO2 98%   BMI 39.75 kg/m  Objective:   Physical Exam Cardiovascular:     Rate and Rhythm: Normal rate and regular rhythm.  Pulmonary:     Effort: Pulmonary effort is normal.     Breath sounds: Normal breath sounds.  Musculoskeletal:     Cervical back:  Neck supple.  Skin:    General: Skin is warm and dry.          Assessment & Plan:      This visit occurred during the SARS-CoV-2 public health emergency.  Safety protocols were in place, including screening questions prior to the visit, additional usage of staff PPE, and extensive cleaning of exam room while observing appropriate contact time as indicated for disinfecting solutions.

## 2021-04-25 NOTE — Assessment & Plan Note (Signed)
Overall healthy diet, has increased activity level, commended her on this.  Agree that she would benefit from bariatric surgery. We will see her back in 1 month. Discussed goals for next visit.

## 2021-04-25 NOTE — Patient Instructions (Signed)
Continue to work on a healthy diet.   Ensure you are consuming 64 ounces of water daily.  Continue exercising. You should be getting 150 minutes of moderate intensity exercise weekly.  Schedule a follow up visit for one month.   It was a pleasure to see you today!

## 2021-05-17 ENCOUNTER — Other Ambulatory Visit: Payer: Self-pay

## 2021-05-17 ENCOUNTER — Ambulatory Visit: Payer: BC Managed Care – PPO | Admitting: Primary Care

## 2021-05-17 ENCOUNTER — Encounter: Payer: Self-pay | Admitting: Primary Care

## 2021-05-17 DIAGNOSIS — E669 Obesity, unspecified: Secondary | ICD-10-CM

## 2021-05-17 DIAGNOSIS — Z6839 Body mass index (BMI) 39.0-39.9, adult: Secondary | ICD-10-CM

## 2021-05-17 NOTE — Patient Instructions (Signed)
Continue exercising. You should be getting 150 minutes of moderate intensity exercise weekly.  Continue to work on a healthy diet. Ensure you are consuming 64 ounces of water daily.  It was a pleasure to see you today!  

## 2021-05-17 NOTE — Progress Notes (Signed)
Subjective:    Patient ID: Laura Gross, female    DOB: [birth year], 35 y.o.   MRN: [id number]  HPI  Laura Gross is a very pleasant 35 y.o. female with a history of obesity who presents today for follow up obesity and weight management.   She is currently pending bariatric surgery and is required to undergo four months of weight management evaluation.   Today is her fourth and final month for follow up. Since her last visit she has increased her exercise with walking. She's not made any changes in her diet.   Diet currently consists of:  Breakfast: Iced coffee Lunch: Left overs, lean cusine  Dinner: Meat, vegetable  Snacks: Popcorn, overall infrequent  Desserts: Once weekly  Beverages: Water, Diet Dr. Reino Kent  Exercise: Walking   She is very excited about the prospect of bariatric surgery. Is motivated to succeed.     Review of Systems  Respiratory:  Negative for shortness of breath.   Cardiovascular:  Negative for chest pain.  Neurological:  Negative for dizziness and headaches.        Past Medical History:  Diagnosis Date   Chicken pox    Family history of adverse reaction to anesthesia    ponv mother   Headache    History of UTI    Hx of varicella    Left supracondylar humerus fracture 03/04/2020   Migraine     Social History   Socioeconomic History   Marital status: Married    Spouse name: Not on file   Number of children: Not on file   Years of education: Not on file   Highest education level: Not on file  Occupational History   Not on file  Tobacco Use   Smoking status: Never   Smokeless tobacco: Never  Vaping Use   Vaping Use: Never used  Substance and Sexual Activity   Alcohol use: Yes    Comment: occasional   Drug use: No   Sexual activity: Yes  Other Topics Concern   Not on file  Social History Narrative   Not on file   Social Determinants of Health   Financial Resource Strain: Not on file  Food Insecurity: Not  on file  Transportation Needs: Not on file  Physical Activity: Not on file  Stress: Not on file  Social Connections: Not on file  Intimate Partner Violence: Not on file    Past Surgical History:  Procedure Laterality Date   APPENDECTOMY  yrs ago   FEMUR SURGERY Left    ORIF HUMERUS FRACTURE Left 03/08/2020   Procedure: OPEN REDUCTION INTERNAL FIXATION (ORIF) HUMERAL SHAFT FRACTURE;  Surgeon: Sheral Apley, MD;  Location: [facility] [place];  Service: Orthopedics;  Laterality: Left;   SPONTANEOUS HIP DISLOCATION REPAIR Left    TONSILLECTOMY  2008 or 2009    Family History  Problem Relation Age of Onset   Diabetes Paternal Grandmother    Cancer Paternal Grandmother        lung   Hyperlipidemia Mother    Arthritis Mother    Cancer Maternal Grandmother        breast   Cancer Maternal Grandfather        nelanoma    Allergies  Allergen Reactions   Penicillins Hives    No current outpatient medications on file prior to visit.   No current facility-administered medications on file prior to visit.    BP 124/82   Pulse 99   Temp 98.1 F (36.7 C) (  Temporal)   Ht 5\' 6"  (1.676 m)   Wt 250 lb (113.4 kg)   SpO2 (!) 84%   BMI 40.35 kg/m  Objective:   Physical Exam Cardiovascular:     Rate and Rhythm: Normal rate and regular rhythm.  Pulmonary:     Effort: Pulmonary effort is normal.     Breath sounds: Normal breath sounds.  Musculoskeletal:     Cervical back: Neck supple.  Skin:    General: Skin is warm and dry.          Assessment & Plan:      This visit occurred during the SARS-CoV-2 public health emergency.  Safety protocols were in place, including screening questions prior to the visit, additional usage of staff PPE, and extensive cleaning of exam room while observing appropriate contact time as indicated for disinfecting solutions.

## 2021-05-17 NOTE — Assessment & Plan Note (Signed)
Compliant to a healthy diet, is exercising more now, commended her on that.  I believe she will be a great candidate for bariatric surgery as she is motivated and has continued to follow instructions.  She will update regarding her progress

## 2021-06-05 ENCOUNTER — Other Ambulatory Visit: Payer: Self-pay

## 2021-06-05 ENCOUNTER — Encounter: Payer: BC Managed Care – PPO | Attending: Surgery | Admitting: Skilled Nursing Facility1

## 2021-06-05 DIAGNOSIS — Z6839 Body mass index (BMI) 39.0-39.9, adult: Secondary | ICD-10-CM | POA: Insufficient documentation

## 2021-06-05 DIAGNOSIS — E669 Obesity, unspecified: Secondary | ICD-10-CM | POA: Insufficient documentation

## 2021-06-05 NOTE — Progress Notes (Signed)
Pre-Operative Nutrition Class:    Patient was seen on 06/05/2021 for Pre-Operative Bariatric Surgery Education at the [facility].    Surgery date:  Surgery type: sleeve Start weight at [facility]: 243.1 Weight today: 239  Samples given per MNT protocol. Patient educated on appropriate usage: Protien 20 shake Lot: [id number] Exp: 03/10/22   Bariatric Advantage Calcium Lot # [id number] Exp: 02/23     procare Vitamins Calcium Lot # [id number] Exp: 01/23   Celebrate Protein Powder   Lot # [id number] Exp: 04/23 The following the learning objectives were met by the patient during this course: Identify Pre-Op Dietary Goals and will begin 2 weeks pre-operatively Identify appropriate sources of fluids and proteins  State protein recommendations and appropriate sources pre and post-operatively Identify Post-Operative Dietary Goals and will follow for 2 weeks post-operatively Identify appropriate multivitamin and calcium sources Describe the need for physical activity post-operatively and will follow MD recommendations State when to call healthcare provider regarding medication questions or post-operative complications When having a diagnosis of diabetes understanding hypoglycemia symptoms and the inclusion of 1 complex carbohydrate per meal  Handouts given during class include: Pre-Op Bariatric Surgery Diet Handout Protein Shake Handout Post-Op Bariatric Surgery Nutrition Handout BELT Program Information Flyer Support Group Information Flyer [facility] Bariatric Supplements Price List  Follow-Up Plan: Patient will follow-up at [facility] 2 weeks post operatively for diet advancement per MD.

## 2021-06-08 NOTE — Progress Notes (Signed)
Sent message, via epic in basket, requesting orders in epic from surgeon.  

## 2021-06-09 ENCOUNTER — Ambulatory Visit: Payer: Self-pay | Admitting: Surgery

## 2021-06-09 NOTE — H&P (Signed)
Surgical Evaluation   Chief Complaint: morbid obesity  HPI: Presents in follow-up regarding surgical treatment of morbid obesity.  No changes to her health since our initial visit 3 months ago.  Weight is stable.  She has several insightful questions today.  She has completed the bariatric pathway with no barriers identified.   CXR/UGI (03/23/21): negative, no hh Dietician: approved Labs- 03/01/21- AST/ALT 39/33, B12 1677 (elev), otherwise   Initial visit 03/01/21: This is a very pleasant and otherwise healthy 35 year old woman who presents to discuss surgical management of severe obesity.  She has been struggling with this for at least the last 6 years, since her daughter was born.  She has tried multiple methods of weight loss including medications, diet, exercise plans without any significant success.  She is interested in pursuing surgical treatment in order to avoid developing complications of obesity, improve associated joint pain, and in extending the quantity and quality of her life.  Both of her parents suffered with obesity and have had bariatric surgery with great success.  She is married with 1 daughter and is also a foster mom to a 40-year-old.  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  She is fairly active in her day-to-day life walking her dog, taking care of the 27-year-old, and managing her household.  She endorses a well-balanced eating patterns without any particular issues or unhealthy habits.  Denies tobacco, drug or significant alcohol use. 245lb/BMI 41  Allergies  Allergen Reactions   Penicillins Hives    Past Medical History:  Diagnosis Date   Chicken pox    Family history of adverse reaction to anesthesia    ponv mother   Headache    History of UTI    Hx of varicella    Left supracondylar humerus fracture 03/04/2020   Migraine     Past Surgical History:  Procedure Laterality Date   APPENDECTOMY  yrs ago   FEMUR SURGERY  Left    ORIF HUMERUS FRACTURE Left 03/08/2020   Procedure: OPEN REDUCTION INTERNAL FIXATION (ORIF) HUMERAL SHAFT FRACTURE;  Surgeon: Sheral Apley, MD;  Location: [facility] ;  Service: Orthopedics;  Laterality: Left;   SPONTANEOUS HIP DISLOCATION REPAIR Left    TONSILLECTOMY  2008 or 2009    Family History  Problem Relation Age of Onset   Diabetes Paternal Grandmother    Cancer Paternal Grandmother        lung   Hyperlipidemia Mother    Arthritis Mother    Cancer Maternal Grandmother        breast   Cancer Maternal Grandfather        nelanoma    Social History   Socioeconomic History   Marital status: Married    Spouse name: Not on file   Number of children: Not on file   Years of education: Not on file   Highest education level: Not on file  Occupational History   Not on file  Tobacco Use   Smoking status: Never   Smokeless tobacco: Never  Vaping Use   Vaping Use: Never used  Substance and Sexual Activity   Alcohol use: Yes    Comment: occasional   Drug use: No   Sexual activity: Yes  Other Topics Concern   Not on file  Social History Narrative   Not on file   Social Determinants of Health   Financial Resource Strain: Not on file  Food Insecurity: Not on file  Transportation  Needs: Not on file  Physical Activity: Not on file  Stress: Not on file  Social Connections: Not on file    No current outpatient medications on file prior to visit.   No current facility-administered medications on file prior to visit.    Review of Systems: a complete, 10pt review of systems was completed with pertinent positives and negatives as documented in the HPI  Physical Exam: Vitals:   06/16/21 1438  BP: 130/72  Pulse: 103  Temp: 37.1 C (98.8 F)  SpO2: 97%  Weight: (!) 109.9 kg (242 lb 3.2 oz)  Height: 165.1 cm (5\' 5" )    Body mass index is 40.3 kg/m.  Alert and well-appearing Unlabored respiration   CBC Latest Ref Rng & Units 02/17/2021  12/30/2019 09/09/2018  WBC 4.0 - 10.5 K/uL 6.5 6.0 -  Hemoglobin 12.0 - 15.0 g/dL 13/09/2018 62.1 30.8  Hematocrit 36.0 - 46.0 % 39.6 39.6 39.0  Platelets 150.0 - 400.0 K/uL 228.0 267.0 -    CMP Latest Ref Rng & Units 02/17/2021 12/30/2019 09/09/2018  Glucose 70 - 99 mg/dL 86 13/09/2018) 846(N)  BUN 6 - 23 mg/dL 5(L) 9 4(L)  Creatinine 0.40 - 1.20 mg/dL 629(B 2.84 1.32  Sodium 135 - 145 mEq/L 139 138 139  Potassium 3.5 - 5.1 mEq/L 4.1 4.2 3.6  Chloride 96 - 112 mEq/L 104 105 103  CO2 19 - 32 mEq/L 26 26 -  Calcium 8.4 - 10.5 mg/dL 9.5 9.4 -  Total Protein 6.0 - 8.3 g/dL 7.4 7.3 -  Total Bilirubin 0.2 - 1.2 mg/dL 0.5 0.5 -  Alkaline Phos 39 - 117 U/L 87 72 -  AST 0 - 37 U/L 57(H) 45(H) -  ALT 0 - 35 U/L 49(H) 32 -    No results found for: INR, PROTIME  Imaging: No results found.   A/P:  MORBID OBESITY (E66.01) Story: She remains a good candidate for sleeve gastrectomy. We have previously discussed the surgery including technical aspects, the risks of bleeding, infection, pain, scarring, injury to intra-abdominal structures, staple line leak or abscess, chronic abdominal pain or nausea, new onset or worsened GERD, DVT/PE, pneumonia, heart attack, stroke, death, failure to reach weight loss goals and weight regain, hernia. Discussed the typical peri-, and postoperative course. Discussed the importance of lifelong behavioral changes to combat the chronic and relapsing disease which is obesity. Plan to proceed as scheduled 06/26/21   Patient Active Problem List   Diagnosis Date Noted   GAD (generalized anxiety disorder) 12/18/2017   Encounter for infertility 07/19/2017   Migraine with status migrainosus, not intractable 08/14/2016   Preventative health care 08/14/2016   Obesity 08/14/2016       08/16/2016, MD [facility]  See AMION to contact appropriate on-call provider

## 2021-06-09 NOTE — H&P (View-Only) (Signed)
Surgical Evaluation   Chief Complaint: morbid obesity  HPI: Presents in follow-up regarding surgical treatment of morbid obesity.  No changes to her health since our initial visit 3 months ago.  Weight is stable.  She has several insightful questions today.  She has completed the bariatric pathway with no barriers identified.   CXR/UGI (03/23/21): negative, no hh Dietician: approved Labs- 03/01/21- AST/ALT 39/33, B12 1677 (elev), otherwise   Initial visit 03/01/21: This is a very pleasant and otherwise healthy 34-year-old woman who presents to discuss surgical management of severe obesity.  She has been struggling with this for at least the last 6 years, since her daughter was born.  She has tried multiple methods of weight loss including medications, diet, exercise plans without any significant success.  She is interested in pursuing surgical treatment in order to avoid developing complications of obesity, improve associated joint pain, and in extending the quantity and quality of her life.  Both of her parents suffered with obesity and have had bariatric surgery with great success.  She is married with 1 daughter and is also a foster mom to a 2-year-old.  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  She is fairly active in her day-to-day life walking her dog, taking care of the 2-year-old, and managing her household.  She endorses a well-balanced eating patterns without any particular issues or unhealthy habits.  Denies tobacco, drug or significant alcohol use. 245lb/BMI 41  Allergies  Allergen Reactions   Penicillins Hives    Past Medical History:  Diagnosis Date   Chicken pox    Family history of adverse reaction to anesthesia    ponv mother   Headache    History of UTI    Hx of varicella    Left supracondylar humerus fracture 03/04/2020   Migraine     Past Surgical History:  Procedure Laterality Date   APPENDECTOMY  yrs ago   FEMUR SURGERY  Left    ORIF HUMERUS FRACTURE Left 03/08/2020   Procedure: OPEN REDUCTION INTERNAL FIXATION (ORIF) HUMERAL SHAFT FRACTURE;  Surgeon: Murphy, Timothy D, MD;  Location: [facility];  Service: Orthopedics;  Laterality: Left;   SPONTANEOUS HIP DISLOCATION REPAIR Left    TONSILLECTOMY  2008 or 2009    Family History  Problem Relation Age of Onset   Diabetes Paternal Grandmother    Cancer Paternal Grandmother        lung   Hyperlipidemia Mother    Arthritis Mother    Cancer Maternal Grandmother        breast   Cancer Maternal Grandfather        nelanoma    Social History   Socioeconomic History   Marital status: Married    Spouse name: Not on file   Number of children: Not on file   Years of education: Not on file   Highest education level: Not on file  Occupational History   Not on file  Tobacco Use   Smoking status: Never   Smokeless tobacco: Never  Vaping Use   Vaping Use: Never used  Substance and Sexual Activity   Alcohol use: Yes    Comment: occasional   Drug use: No   Sexual activity: Yes  Other Topics Concern   Not on file  Social History Narrative   Not on file   Social Determinants of Health   Financial Resource Strain: Not on file  Food Insecurity: Not on file  Transportation   Needs: Not on file  Physical Activity: Not on file  Stress: Not on file  Social Connections: Not on file    No current outpatient medications on file prior to visit.   No current facility-administered medications on file prior to visit.    Review of Systems: a complete, 10pt review of systems was completed with pertinent positives and negatives as documented in the HPI  Physical Exam: Vitals:   06/16/21 1438  BP: 130/72  Pulse: 103  Temp: 37.1 C (98.8 F)  SpO2: 97%  Weight: (!) 109.9 kg (242 lb 3.2 oz)  Height: 165.1 cm (5\' 5" )    Body mass index is 40.3 kg/m.  Alert and well-appearing Unlabored respiration   CBC Latest Ref Rng & Units 02/17/2021  12/30/2019 09/09/2018  WBC 4.0 - 10.5 K/uL 6.5 6.0 -  Hemoglobin 12.0 - 15.0 g/dL 13/09/2018 62.1 30.8  Hematocrit 36.0 - 46.0 % 39.6 39.6 39.0  Platelets 150.0 - 400.0 K/uL 228.0 267.0 -    CMP Latest Ref Rng & Units 02/17/2021 12/30/2019 09/09/2018  Glucose 70 - 99 mg/dL 86 13/09/2018) 846(N)  BUN 6 - 23 mg/dL 5(L) 9 4(L)  Creatinine 0.40 - 1.20 mg/dL 629(B 2.84 1.32  Sodium 135 - 145 mEq/L 139 138 139  Potassium 3.5 - 5.1 mEq/L 4.1 4.2 3.6  Chloride 96 - 112 mEq/L 104 105 103  CO2 19 - 32 mEq/L 26 26 -  Calcium 8.4 - 10.5 mg/dL 9.5 9.4 -  Total Protein 6.0 - 8.3 g/dL 7.4 7.3 -  Total Bilirubin 0.2 - 1.2 mg/dL 0.5 0.5 -  Alkaline Phos 39 - 117 U/L 87 72 -  AST 0 - 37 U/L 57(H) 45(H) -  ALT 0 - 35 U/L 49(H) 32 -    No results found for: INR, PROTIME  Imaging: No results found.   A/P:  MORBID OBESITY (E66.01) Story: She remains a good candidate for sleeve gastrectomy. We have previously discussed the surgery including technical aspects, the risks of bleeding, infection, pain, scarring, injury to intra-abdominal structures, staple line leak or abscess, chronic abdominal pain or nausea, new onset or worsened GERD, DVT/PE, pneumonia, heart attack, stroke, death, failure to reach weight loss goals and weight regain, hernia. Discussed the typical peri-, and postoperative course. Discussed the importance of lifelong behavioral changes to combat the chronic and relapsing disease which is obesity. Plan to proceed as scheduled 06/26/21   Patient Active Problem List   Diagnosis Date Noted   GAD (generalized anxiety disorder) 12/18/2017   Encounter for infertility 07/19/2017   Migraine with status migrainosus, not intractable 08/14/2016   Preventative health care 08/14/2016   Obesity 08/14/2016       08/16/2016, MD [facility]  See AMION to contact appropriate on-call provider

## 2021-06-12 ENCOUNTER — Ambulatory Visit: Payer: BC Managed Care – PPO

## 2021-06-16 ENCOUNTER — Encounter (HOSPITAL_COMMUNITY): Payer: Self-pay

## 2021-06-16 NOTE — Patient Instructions (Signed)
DUE TO COVID-19 ONLY ONE VISITOR IS ALLOWED TO COME WITH YOU AND STAY IN THE WAITING ROOM ONLY DURING PRE OP AND PROCEDURE DAY OF SURGERY. THE 2 VISITORS  MAY VISIT WITH YOU AFTER SURGERY IN YOUR PRIVATE ROOM DURING VISITING HOURS ONLY!  YOU NEED TO HAVE A COVID 19 TEST ON_8/25/22______ THIS TEST MUST BE DONE BEFORE SURGERY,                 Laura Gross     Your procedure is scheduled on: 06/26/21   Report to [facility] Main  Entrance   Report to short stay at 5:15 AM     Call this number if you have problems the morning of surgery [phone]   BRUSH YOUR TEETH MORNING OF SURGERY AND RINSE YOUR MOUTH OUT, NO CHEWING GUM CANDY OR MINTS.     NO SOLID FOOD AFTER 6:00 PM THE NIGHT BEFORE YOUR SURGERY.   YOU MAY DRINK CLEAR FLUIDS  until 4:30 am  THE G2 DRINK YOU DRINK BEFORE YOU LEAVE HOME WILL BE THE LAST FLUIDS YOU DRINK BEFORE SURGERY.   PAIN IS EXPECTED AFTER SURGERY AND WILL NOT BE COMPLETELY ELIMINATED.   AMBULATION AND TYLENOL WILL HELP REDUCE INCISIONAL AND GAS PAIN. MOVEMENT IS KEY!  YOU ARE EXPECTED TO BE OUT OF BED WITHIN 4 HOURS OF ADMISSION TO YOUR PATIENT ROOM.  SITTING IN THE RECLINER THROUGHOUT THE DAY IS IMPORTANT FOR DRINKING FLUIDS AND MOVING GAS THROUGHOUT THE GI TRACT.  COMPRESSION STOCKINGS SHOULD BE WORN [facility] STAY UNLESS YOU ARE WALKING.   INCENTIVE SPIROMETER SHOULD BE USED EVERY HOUR WHILE AWAKE TO DECREASE POST-OPERATIVE COMPLICATIONS SUCH AS PNEUMONIA.  WHEN DISCHARGED HOME, IT IS IMPORTANT TO CONTINUE TO WALK EVERY HOUR AND USE THE INCENTIVE SPIROMETER EVERY HOUR.         Take these medicines the morning of surgery with A SIP OF WATER: none                                 You may not have any metal on your body including hair pins and              piercings  Do not wear jewelry, make-up, lotions, powders or perfumes, deodorant             Do not wear nail polish on your fingernails.  Do not shave  48  hours prior to surgery.               Do not bring valuables to the hospital. [place] IS NOT             RESPONSIBLE   FOR VALUABLES.  Contacts, dentures or bridgework may not be worn into surgery.                 Please read over the following fact sheets you were given: _____________________________________________________________________             [facility] - Preparing for Surgery Before surgery, you can play an important role.  Because skin is not sterile, your skin needs to be as free of germs as possible.  You can reduce the number of germs on your skin by washing with CHG (chlorahexidine gluconate) soap before surgery.  CHG is an antiseptic cleaner which kills germs and bonds with the skin to continue killing germs even after washing. Please DO NOT use if you have  an allergy to CHG or antibacterial soaps.  If your skin becomes reddened/irritated stop using the CHG and inform your nurse when you arrive at Short Stay. Do not shave (including legs and underarms) for at least 48 hours prior to the first CHG shower  Please follow these instructions carefully:  1.  Shower with CHG Soap the night before surgery and the  morning of Surgery.  2.  If you choose to wash your hair, wash your hair first as usual with your  normal  shampoo.  3.  After you shampoo, rinse your hair and body thoroughly to remove the  shampoo.                            4.  Use CHG as you would any other liquid soap.  You can apply chg directly  to the skin and wash                       Gently with a scrungie or clean washcloth.  5.  Apply the CHG Soap to your body ONLY FROM THE NECK DOWN.   Do not use on face/ open                           Wound or open sores. Avoid contact with eyes, ears mouth and genitals (private parts).                       Wash face,  Genitals (private parts) with your normal soap.             6.  Wash thoroughly, paying special attention to the area where your surgery  will be  performed.  7.  Thoroughly rinse your body with warm water from the neck down.  8.  DO NOT shower/wash with your normal soap after using and rinsing off  the CHG Soap.             9.  Pat yourself dry with a clean towel.            10.  Wear clean pajamas.            11.  Place clean sheets on your bed the night of your first shower and do not  sleep with pets. Day of Surgery : Do not apply any lotions/deodorants the morning of surgery.  Please wear clean clothes to the hospital/surgery center.  FAILURE TO FOLLOW THESE INSTRUCTIONS MAY RESULT IN THE CANCELLATION OF YOUR SURGERY PATIENT SIGNATURE_________________________________  NURSE SIGNATURE__________________________________  ________________________________________________________________________   Laura Gross  An incentive spirometer is a tool that can help keep your lungs clear and active. This tool measures how well you are filling your lungs with each breath. Taking long deep breaths may help reverse or decrease the chance of developing breathing (pulmonary) problems (especially infection) following: A long period of time when you are unable to move or be active. BEFORE THE PROCEDURE  If the spirometer includes an indicator to show your best effort, your nurse or respiratory therapist will set it to a desired goal. If possible, sit up straight or lean slightly forward. Try not to slouch. Hold the incentive spirometer in an upright position. INSTRUCTIONS FOR USE  Sit on the edge of your bed if possible, or sit up as far as you can in bed or on a chair. Hold the incentive  spirometer in an upright position. Breathe out normally. Place the mouthpiece in your mouth and seal your lips tightly around it. Breathe in slowly and as deeply as possible, raising the piston or the ball toward the top of the column. Hold your breath for 3-5 seconds or for as long as possible. Allow the piston or ball to fall to the bottom of the  column. Remove the mouthpiece from your mouth and breathe out normally. Rest for a few seconds and repeat Steps 1 through 7 at least 10 times every 1-2 hours when you are awake. Take your time and take a few normal breaths between deep breaths. The spirometer may include an indicator to show your best effort. Use the indicator as a goal to work toward during each repetition. After each set of 10 deep breaths, practice coughing to be sure your lungs are clear. If you have an incision (the cut made at the time of surgery), support your incision when coughing by placing a pillow or rolled up towels firmly against it. Once you are able to get out of bed, walk around indoors and cough well. You may stop using the incentive spirometer when instructed by your caregiver.  RISKS AND COMPLICATIONS Take your time so you do not get dizzy or light-headed. If you are in pain, you may need to take or ask for pain medication before doing incentive spirometry. It is harder to take a deep breath if you are having pain. AFTER USE Rest and breathe slowly and easily. It can be helpful to keep track of a log of your progress. Your caregiver can provide you with a simple table to help with this. If you are using the spirometer at home, follow these instructions: SEEK MEDICAL CARE IF:  You are having difficultly using the spirometer. You have trouble using the spirometer as often as instructed. Your pain medication is not giving enough relief while using the spirometer. You develop fever of 100.5 F (38.1 C) or higher. SEEK IMMEDIATE MEDICAL CARE IF:  You cough up bloody sputum that had not been present before. You develop fever of 102 F (38.9 C) or greater. You develop worsening pain at or near the incision site. MAKE SURE YOU:  Understand these instructions. Will watch your condition. Will get help right away if you are not doing well or get worse. Document Released: 02/25/2007 Document Revised: 01/07/2012  Document Reviewed: 04/28/2007 [facility] Patient Information 2014 [place], [place].   ________________________________________________________________________

## 2021-06-19 ENCOUNTER — Other Ambulatory Visit: Payer: Self-pay

## 2021-06-19 ENCOUNTER — Encounter (HOSPITAL_COMMUNITY): Payer: Self-pay

## 2021-06-19 ENCOUNTER — Encounter (HOSPITAL_COMMUNITY)
Admission: RE | Admit: 2021-06-19 | Discharge: 2021-06-19 | Disposition: A | Discharge status: Home / Self Care | Payer: BC Managed Care – PPO | Source: Ambulatory Visit | Attending: Surgery | Admitting: Surgery

## 2021-06-19 DIAGNOSIS — Z01812 Encounter for preprocedural laboratory examination: Secondary | ICD-10-CM | POA: Insufficient documentation

## 2021-06-19 LAB — CBC WITH DIFFERENTIAL/PLATELET
Abs Immature Granulocytes: 0.01 10*3/uL (ref 0.00–0.07)
Basophils Absolute: 0.1 10*3/uL (ref 0.0–0.1)
Basophils Relative: 1 %
Eosinophils Absolute: 0.1 10*3/uL (ref 0.0–0.5)
Eosinophils Relative: 1 %
HCT: 39.6 % (ref 36.0–46.0)
Hemoglobin: 13.5 g/dL (ref 12.0–15.0)
Immature Granulocytes: 0 %
Lymphocytes Relative: 46 %
Lymphs Abs: 3.2 10*3/uL (ref 0.7–4.0)
MCH: 29.5 pg (ref 26.0–34.0)
MCHC: 34.1 g/dL (ref 30.0–36.0)
MCV: 86.7 fL (ref 80.0–100.0)
Monocytes Absolute: 0.5 10*3/uL (ref 0.1–1.0)
Monocytes Relative: 7 %
Neutro Abs: 3.1 10*3/uL (ref 1.7–7.7)
Neutrophils Relative %: 45 %
Platelets: 237 10*3/uL (ref 150–400)
RBC: 4.57 MIL/uL (ref 3.87–5.11)
RDW: 13.1 % (ref 11.5–15.5)
WBC: 7 10*3/uL (ref 4.0–10.5)
nRBC: 0 % (ref 0.0–0.2)

## 2021-06-19 LAB — COMPREHENSIVE METABOLIC PANEL
ALT: 35 U/L (ref 0–44)
AST: 43 U/L — ABNORMAL HIGH (ref 15–41)
Albumin: 4.5 g/dL (ref 3.5–5.0)
Alkaline Phosphatase: 59 U/L (ref 38–126)
Anion gap: 7 (ref 5–15)
BUN: 9 mg/dL (ref 6–20)
CO2: 24 mmol/L (ref 22–32)
Calcium: 9.4 mg/dL (ref 8.9–10.3)
Chloride: 107 mmol/L (ref 98–111)
Creatinine, Ser: 0.61 mg/dL (ref 0.44–1.00)
GFR, Estimated: >60 mL/min (ref >60)
Glucose, Bld: 108 mg/dL — ABNORMAL HIGH (ref 70–99)
Potassium: 4.1 mmol/L (ref 3.5–5.1)
Sodium: 138 mmol/L (ref 135–145)
Total Bilirubin: 0.8 mg/dL (ref 0.3–1.2)
Total Protein: 7.5 g/dL (ref 6.5–8.1)

## 2021-06-19 NOTE — Progress Notes (Signed)
  Date COVID test 06/22/21  PCP - Vernona Rieger NP Cardiologist - none  Chest x-ray -  EKG - 03/24/21-epic Stress Test - no ECHO - no Cardiac Cath - NA Pacemaker/ICD device last checked:NA  Sleep Study - no CPAP -   Fasting Blood Sugar - NA Checks Blood Sugar _____ times a day  Blood Thinner Instructions:NA Aspirin Instructions: Last Dose:  Anesthesia review: no  Patient denies shortness of breath, fever, cough and chest pain at PAT appointment Pt has no SOB with any activities.  Patient verbalized understanding of instructions that were given to them at the PAT appointment. Patient was also instructed that they will need to review over the PAT instructions again at home before surgery. yes

## 2021-06-22 ENCOUNTER — Other Ambulatory Visit: Payer: Self-pay | Admitting: Surgery

## 2021-06-23 LAB — SARS CORONAVIRUS 2 (TAT 6-24 HRS): SARS Coronavirus 2: NEGATIVE

## 2021-06-25 MED ORDER — BUPIVACAINE LIPOSOME 1.3 % IJ SUSP
20.0000 mL | Freq: Once | INTRAMUSCULAR | Status: DC
  Filled 2021-06-25: qty 20

## 2021-06-25 MED ORDER — GENTAMICIN SULFATE 40 MG/ML IJ SOLN
1.5000 mg/kg | INTRAVENOUS | Status: AC
  Administered 2021-06-26: 160 mg via INTRAVENOUS
  Filled 2021-06-25: qty 4

## 2021-06-25 NOTE — Anesthesia Preprocedure Evaluation (Addendum)
Anesthesia Evaluation  Patient identified by MRN, date of birth, ID band Patient awake    Reviewed: Allergy & Precautions, NPO status , Patient's Chart, lab work & pertinent test results  Airway Mallampati: II  TM Distance: >3 FB Neck ROM: Full    Dental  (+) Teeth Intact   Pulmonary neg pulmonary ROS,    Pulmonary exam normal        Cardiovascular negative cardio ROS   Rhythm:Regular Rate:Normal     Neuro/Psych  Headaches, Anxiety    GI/Hepatic negative GI ROS, Neg liver ROS,   Endo/Other  Morbid obesity  Renal/GU negative Renal ROS  negative genitourinary   Musculoskeletal negative musculoskeletal ROS (+)   Abdominal (+)  Abdomen: soft. Bowel sounds: normal.  Peds  Hematology negative hematology ROS (+)   Anesthesia Other Findings   Reproductive/Obstetrics                            Anesthesia Physical Anesthesia Plan  ASA: 3  Anesthesia Plan: General   Post-op Pain Management:    Induction: Intravenous  PONV Risk Score and Plan: 3 and Ondansetron, Dexamethasone, Midazolam and Treatment may vary due to age or medical condition  Airway Management Planned: Mask and Oral ETT  Additional Equipment: None  Intra-op Plan:   Post-operative Plan: Extubation in OR  Informed Consent: I have reviewed the patients History and Physical, chart, labs and discussed the procedure including the risks, benefits and alternatives for the proposed anesthesia with the patient or authorized representative who has indicated his/her understanding and acceptance.     Dental advisory given  Plan Discussed with: CRNA  Anesthesia Plan Comments: (Lab Results      Component                Value               Date                      WBC                      7.0                 06/19/2021                HGB                      13.5                06/19/2021                HCT                       39.6                06/19/2021                MCV                      86.7                06/19/2021                PLT                      237  06/19/2021           Lab Results      Component                Value               Date                      NA                       138                 06/19/2021                K                        4.1                 06/19/2021                CO2                      24                  06/19/2021                GLUCOSE                  108 (H)             06/19/2021                BUN                      9                   06/19/2021                CREATININE               0.61                06/19/2021                CALCIUM                  9.4                 06/19/2021                GFRNONAA                 >60                 06/19/2021           Lab Results      Component                Value               Date                      PREGTESTUR               NEGATIVE            03/08/2020                HCG                      <  5.0                09/09/2018          )       Anesthesia Quick Evaluation

## 2021-06-26 ENCOUNTER — Encounter (HOSPITAL_COMMUNITY): Payer: Self-pay | Admitting: Surgery

## 2021-06-26 ENCOUNTER — Ambulatory Visit (HOSPITAL_COMMUNITY): Payer: BC Managed Care – PPO | Admitting: Anesthesiology

## 2021-06-26 ENCOUNTER — Encounter (HOSPITAL_COMMUNITY)
Admission: RE | Disposition: A | Discharge status: Home / Self Care | Payer: Self-pay | Source: Ambulatory Visit | Attending: Surgery

## 2021-06-26 ENCOUNTER — Observation Stay (HOSPITAL_COMMUNITY)
Admission: RE | Admit: 2021-06-26 | Discharge: 2021-06-27 | Disposition: A | Discharge status: Home / Self Care | Payer: BC Managed Care – PPO | Source: Ambulatory Visit | Attending: Surgery | Admitting: Surgery

## 2021-06-26 DIAGNOSIS — F411 Generalized anxiety disorder: Secondary | ICD-10-CM | POA: Diagnosis not present

## 2021-06-26 DIAGNOSIS — Z6839 Body mass index (BMI) 39.0-39.9, adult: Secondary | ICD-10-CM | POA: Diagnosis not present

## 2021-06-26 DIAGNOSIS — Z6841 Body Mass Index (BMI) 40.0 and over, adult: Secondary | ICD-10-CM | POA: Insufficient documentation

## 2021-06-26 DIAGNOSIS — G43901 Migraine, unspecified, not intractable, with status migrainosus: Secondary | ICD-10-CM | POA: Diagnosis not present

## 2021-06-26 HISTORY — PX: UPPER GI ENDOSCOPY: SHX6162

## 2021-06-26 HISTORY — DX: Morbid (severe) obesity due to excess calories: E66.01

## 2021-06-26 HISTORY — PX: LAPAROSCOPIC GASTRIC SLEEVE RESECTION: SHX5895

## 2021-06-26 LAB — TYPE AND SCREEN
ABO/RH(D): O POS
Antibody Screen: NEGATIVE

## 2021-06-26 LAB — PREGNANCY, URINE: Preg Test, Ur: NEGATIVE

## 2021-06-26 SURGERY — GASTRECTOMY, SLEEVE, LAPAROSCOPIC
Anesthesia: General | Site: Abdomen

## 2021-06-26 MED ORDER — PROPOFOL 10 MG/ML IV BOLUS
INTRAVENOUS | Status: DC | PRN
  Administered 2021-06-26: 200 mg via INTRAVENOUS

## 2021-06-26 MED ORDER — PHENYLEPHRINE 40 MCG/ML (10ML) SYRINGE FOR IV PUSH (FOR BLOOD PRESSURE SUPPORT)
PREFILLED_SYRINGE | INTRAVENOUS | Status: DC | PRN
  Administered 2021-06-26 (×2): 80 ug via INTRAVENOUS

## 2021-06-26 MED ORDER — ACETAMINOPHEN 160 MG/5ML PO SOLN: 1000.0000 mg | Freq: Three times a day (TID) | ORAL | Status: DC

## 2021-06-26 MED ORDER — STERILE WATER FOR IRRIGATION IR SOLN
Status: DC | PRN
  Administered 2021-06-26: 1000 mL

## 2021-06-26 MED ORDER — METHOCARBAMOL 1000 MG/10ML IJ SOLN
500.0000 mg | Freq: Four times a day (QID) | INTRAVENOUS | Status: DC | PRN
  Administered 2021-06-26: 500 mg via INTRAVENOUS
  Filled 2021-06-26: qty 5
  Filled 2021-06-26: qty 500

## 2021-06-26 MED ORDER — PANTOPRAZOLE SODIUM 40 MG IV SOLR
40.0000 mg | Freq: Every day | INTRAVENOUS | Status: DC
  Administered 2021-06-26: 40 mg via INTRAVENOUS
  Filled 2021-06-26: qty 40

## 2021-06-26 MED ORDER — LACTATED RINGERS IR SOLN
Status: DC | PRN
  Administered 2021-06-26: 1000 mL

## 2021-06-26 MED ORDER — OXYCODONE HCL 5 MG PO TABS
ORAL_TABLET | ORAL | Status: AC
  Filled 2021-06-26: qty 1

## 2021-06-26 MED ORDER — PROPOFOL 10 MG/ML IV BOLUS
INTRAVENOUS | Status: AC
  Filled 2021-06-26: qty 20

## 2021-06-26 MED ORDER — CHLORHEXIDINE GLUCONATE 0.12 % MT SOLN
15.0000 mL | Freq: Once | OROMUCOSAL | Status: AC
  Administered 2021-06-26: 15 mL via OROMUCOSAL

## 2021-06-26 MED ORDER — ROCURONIUM BROMIDE 10 MG/ML (PF) SYRINGE
PREFILLED_SYRINGE | INTRAVENOUS | Status: DC | PRN
  Administered 2021-06-26: 60 mg via INTRAVENOUS

## 2021-06-26 MED ORDER — LIDOCAINE 2% (20 MG/ML) 5 ML SYRINGE
INTRAMUSCULAR | Status: DC | PRN
  Administered 2021-06-26: 1.5 mg/kg/h via INTRAVENOUS

## 2021-06-26 MED ORDER — GABAPENTIN 300 MG PO CAPS
300.0000 mg | ORAL_CAPSULE | ORAL | Status: AC
Start: 2021-06-26 — End: 2021-06-26
  Administered 2021-06-26: 300 mg via ORAL
  Filled 2021-06-26: qty 1

## 2021-06-26 MED ORDER — SUGAMMADEX SODIUM 200 MG/2ML IV SOLN
INTRAVENOUS | Status: DC | PRN
  Administered 2021-06-26: 200 mg via INTRAVENOUS

## 2021-06-26 MED ORDER — SCOPOLAMINE 1 MG/3DAYS TD PT72
1.0000 | MEDICATED_PATCH | TRANSDERMAL | Status: DC
  Administered 2021-06-26: 1.5 mg via TRANSDERMAL
  Filled 2021-06-26: qty 1

## 2021-06-26 MED ORDER — OXYCODONE HCL 5 MG/5ML PO SOLN
5.0000 mg | Freq: Once | ORAL | Status: AC | PRN
Start: 2021-06-26 — End: 2021-06-26

## 2021-06-26 MED ORDER — BUPIVACAINE LIPOSOME 1.3 % IJ SUSP
INTRAMUSCULAR | Status: DC | PRN
  Administered 2021-06-26: 20 mL

## 2021-06-26 MED ORDER — DOCUSATE SODIUM 100 MG PO CAPS
100.0000 mg | ORAL_CAPSULE | Freq: Two times a day (BID) | ORAL | Status: DC
  Administered 2021-06-26 – 2021-06-27 (×2): 100 mg via ORAL
  Filled 2021-06-26 (×2): qty 1

## 2021-06-26 MED ORDER — HYDROMORPHONE HCL 1 MG/ML IJ SOLN
0.5000 mg | INTRAMUSCULAR | Status: DC | PRN
Start: 2021-06-26 — End: 2021-06-27

## 2021-06-26 MED ORDER — OXYCODONE HCL 5 MG/5ML PO SOLN: 5.0000 mg | Freq: Four times a day (QID) | ORAL | Status: DC | PRN

## 2021-06-26 MED ORDER — APREPITANT 40 MG PO CAPS
40.0000 mg | ORAL_CAPSULE | ORAL | Status: AC
  Administered 2021-06-26: 40 mg via ORAL
  Filled 2021-06-26: qty 1

## 2021-06-26 MED ORDER — KETOROLAC TROMETHAMINE 15 MG/ML IJ SOLN: 15.0000 mg | Freq: Three times a day (TID) | INTRAMUSCULAR | Status: DC | PRN

## 2021-06-26 MED ORDER — HEPARIN SODIUM (PORCINE) 5000 UNIT/ML IJ SOLN
5000.0000 [IU] | INTRAMUSCULAR | Status: AC
  Administered 2021-06-26: 5000 [IU] via SUBCUTANEOUS
  Filled 2021-06-26: qty 1

## 2021-06-26 MED ORDER — MIDAZOLAM HCL 2 MG/2ML IJ SOLN
INTRAMUSCULAR | Status: AC
  Filled 2021-06-26: qty 2

## 2021-06-26 MED ORDER — OXYCODONE HCL 5 MG PO TABS
5.0000 mg | ORAL_TABLET | Freq: Once | ORAL | Status: AC | PRN
  Administered 2021-06-26: 5 mg via ORAL

## 2021-06-26 MED ORDER — PHENYLEPHRINE 40 MCG/ML (10ML) SYRINGE FOR IV PUSH (FOR BLOOD PRESSURE SUPPORT)
PREFILLED_SYRINGE | INTRAVENOUS | Status: AC
  Filled 2021-06-26: qty 10

## 2021-06-26 MED ORDER — ONDANSETRON HCL 4 MG/2ML IJ SOLN: 4.0000 mg | INTRAMUSCULAR | Status: DC | PRN

## 2021-06-26 MED ORDER — FENTANYL CITRATE PF 50 MCG/ML IJ SOSY
PREFILLED_SYRINGE | INTRAMUSCULAR | Status: AC
  Administered 2021-06-26: 25 ug via INTRAVENOUS
  Filled 2021-06-26: qty 1

## 2021-06-26 MED ORDER — DEXMEDETOMIDINE HCL IN NACL 200 MCG/50ML IV SOLN
INTRAVENOUS | Status: DC | PRN
  Administered 2021-06-26: 12 ug via INTRAVENOUS

## 2021-06-26 MED ORDER — ENOXAPARIN SODIUM 30 MG/0.3ML IJ SOSY
30.0000 mg | PREFILLED_SYRINGE | Freq: Two times a day (BID) | INTRAMUSCULAR | Status: DC
  Administered 2021-06-26 – 2021-06-27 (×2): 30 mg via SUBCUTANEOUS
  Filled 2021-06-26 (×2): qty 0.3

## 2021-06-26 MED ORDER — ACETAMINOPHEN 500 MG PO TABS
1000.0000 mg | ORAL_TABLET | Freq: Three times a day (TID) | ORAL | Status: DC
  Administered 2021-06-26 – 2021-06-27 (×4): 1000 mg via ORAL
  Filled 2021-06-26 (×4): qty 2

## 2021-06-26 MED ORDER — SIMETHICONE 80 MG PO CHEW: 80.0000 mg | CHEWABLE_TABLET | Freq: Four times a day (QID) | ORAL | Status: DC | PRN

## 2021-06-26 MED ORDER — HYDRALAZINE HCL 20 MG/ML IJ SOLN
10.0000 mg | INTRAMUSCULAR | Status: DC | PRN
Start: 2021-06-26 — End: 2021-06-27

## 2021-06-26 MED ORDER — LACTATED RINGERS IV SOLN: INTRAVENOUS | Status: DC

## 2021-06-26 MED ORDER — ONDANSETRON HCL 4 MG/2ML IJ SOLN
INTRAMUSCULAR | Status: DC | PRN
Start: 2021-06-26 — End: 2021-06-26
  Administered 2021-06-26: 4 mg via INTRAVENOUS

## 2021-06-26 MED ORDER — ENSURE MAX PROTEIN PO LIQD
2.0000 [oz_av] | ORAL | Status: DC
  Administered 2021-06-27 (×5): 2 [oz_av] via ORAL

## 2021-06-26 MED ORDER — METOCLOPRAMIDE HCL 5 MG/ML IJ SOLN
10.0000 mg | Freq: Four times a day (QID) | INTRAMUSCULAR | Status: DC
  Administered 2021-06-26 – 2021-06-27 (×5): 10 mg via INTRAVENOUS
  Filled 2021-06-26 (×5): qty 2

## 2021-06-26 MED ORDER — KETAMINE HCL 10 MG/ML IJ SOLN
INTRAMUSCULAR | Status: AC
  Filled 2021-06-26: qty 1

## 2021-06-26 MED ORDER — SODIUM CHLORIDE 0.9 % IV SOLN: INTRAVENOUS | Status: DC

## 2021-06-26 MED ORDER — ONDANSETRON HCL 4 MG/2ML IJ SOLN
INTRAMUSCULAR | Status: AC
  Filled 2021-06-26: qty 2

## 2021-06-26 MED ORDER — CHLORHEXIDINE GLUCONATE 4 % EX LIQD: 60.0000 mL | Freq: Once | CUTANEOUS | Status: DC

## 2021-06-26 MED ORDER — ORAL CARE MOUTH RINSE: 15.0000 mL | Freq: Once | OROMUCOSAL | Status: AC

## 2021-06-26 MED ORDER — TRAMADOL HCL 50 MG PO TABS: 50.0000 mg | ORAL_TABLET | Freq: Four times a day (QID) | ORAL | Status: DC | PRN

## 2021-06-26 MED ORDER — PROMETHAZINE HCL 25 MG/ML IJ SOLN
INTRAMUSCULAR | Status: AC
  Administered 2021-06-26: 12.5 mg via INTRAVENOUS
  Filled 2021-06-26: qty 1

## 2021-06-26 MED ORDER — PROMETHAZINE HCL 25 MG/ML IJ SOLN: 6.2500 mg | INTRAMUSCULAR | Status: DC | PRN

## 2021-06-26 MED ORDER — FENTANYL CITRATE (PF) 250 MCG/5ML IJ SOLN
INTRAMUSCULAR | Status: AC
  Filled 2021-06-26: qty 5

## 2021-06-26 MED ORDER — METOPROLOL TARTRATE 5 MG/5ML IV SOLN
5.0000 mg | Freq: Four times a day (QID) | INTRAVENOUS | Status: DC | PRN
Start: 2021-06-26 — End: 2021-06-27

## 2021-06-26 MED ORDER — LIDOCAINE 2% (20 MG/ML) 5 ML SYRINGE
INTRAMUSCULAR | Status: DC | PRN
  Administered 2021-06-26: 60 mg via INTRAVENOUS

## 2021-06-26 MED ORDER — ACETAMINOPHEN 500 MG PO TABS
1000.0000 mg | ORAL_TABLET | ORAL | Status: AC
  Administered 2021-06-26: 1000 mg via ORAL
  Filled 2021-06-26: qty 2

## 2021-06-26 MED ORDER — BUPIVACAINE-EPINEPHRINE (PF) 0.25% -1:200000 IJ SOLN
INTRAMUSCULAR | Status: AC
  Filled 2021-06-26: qty 30

## 2021-06-26 MED ORDER — FENTANYL CITRATE PF 50 MCG/ML IJ SOSY: 25.0000 ug | PREFILLED_SYRINGE | INTRAMUSCULAR | Status: DC | PRN

## 2021-06-26 MED ORDER — DEXAMETHASONE SODIUM PHOSPHATE 10 MG/ML IJ SOLN
INTRAMUSCULAR | Status: DC | PRN
  Administered 2021-06-26: 5 mg via INTRAVENOUS

## 2021-06-26 MED ORDER — 0.9 % SODIUM CHLORIDE (POUR BTL) OPTIME
TOPICAL | Status: DC | PRN
  Administered 2021-06-26: 1000 mL

## 2021-06-26 MED ORDER — FENTANYL CITRATE (PF) 100 MCG/2ML IJ SOLN
INTRAMUSCULAR | Status: DC | PRN
  Administered 2021-06-26 (×2): 50 ug via INTRAVENOUS
  Administered 2021-06-26: 100 ug via INTRAVENOUS

## 2021-06-26 MED ORDER — KETAMINE HCL 10 MG/ML IJ SOLN
INTRAMUSCULAR | Status: DC | PRN
  Administered 2021-06-26: 30 mg via INTRAVENOUS

## 2021-06-26 MED ORDER — MIDAZOLAM HCL 5 MG/5ML IJ SOLN
INTRAMUSCULAR | Status: DC | PRN
  Administered 2021-06-26: 2 mg via INTRAVENOUS

## 2021-06-26 MED ORDER — GABAPENTIN 100 MG PO CAPS
200.0000 mg | ORAL_CAPSULE | Freq: Two times a day (BID) | ORAL | Status: DC
  Administered 2021-06-26 – 2021-06-27 (×2): 200 mg via ORAL
  Filled 2021-06-26 (×2): qty 2

## 2021-06-26 MED ORDER — BUPIVACAINE-EPINEPHRINE 0.25% -1:200000 IJ SOLN
INTRAMUSCULAR | Status: DC | PRN
  Administered 2021-06-26: 30 mL

## 2021-06-26 SURGICAL SUPPLY — 74 items
APL PRP STRL LF DISP 70% ISPRP (MISCELLANEOUS) ×4
APL SKNCLS STERI-STRIP NONHPOA (GAUZE/BANDAGES/DRESSINGS) ×2
APPLIER CLIP ROT 10 11.4 M/L (STAPLE)
APPLIER CLIP ROT 13.4 12 LRG (CLIP) ×3
APR CLP LRG 13.4X12 ROT 20 MLT (CLIP) ×2
APR CLP MED LRG 11.4X10 (STAPLE)
BAG COUNTER SPONGE SURGICOUNT (BAG) IMPLANT
BAG LAPAROSCOPIC 12 15 PORT 16 (BASKET) IMPLANT
BAG RETRIEVAL 12/15 (BASKET) ×3
BAG SPNG CNTER NS LX DISP (BAG)
BENZOIN TINCTURE PRP APPL 2/3 (GAUZE/BANDAGES/DRESSINGS) ×3 IMPLANT
BLADE SURG SZ11 CARB STEEL (BLADE) ×3 IMPLANT
BNDG ADH 1X3 SHEER STRL LF (GAUZE/BANDAGES/DRESSINGS) ×18 IMPLANT
BNDG ADH THN 3X1 STRL LF (GAUZE/BANDAGES/DRESSINGS) ×12
CABLE HIGH FREQUENCY MONO STRZ (ELECTRODE) ×3 IMPLANT
CHLORAPREP W/TINT 26 (MISCELLANEOUS) ×6 IMPLANT
CLIP APPLIE ROT 10 11.4 M/L (STAPLE) IMPLANT
CLIP APPLIE ROT 13.4 12 LRG (CLIP) IMPLANT
COVER SURGICAL LIGHT HANDLE (MISCELLANEOUS) ×3 IMPLANT
DECANTER SPIKE VIAL GLASS SM (MISCELLANEOUS) ×3 IMPLANT
DEVICE SUT QUICK LOAD TK 5 (STAPLE) IMPLANT
DEVICE SUT TI-KNOT TK 5X26 (MISCELLANEOUS) IMPLANT
DRAPE UTILITY XL STRL (DRAPES) ×6 IMPLANT
ELECT REM PT RETURN 15FT ADLT (MISCELLANEOUS) ×3 IMPLANT
GAUZE SPONGE 4X4 12PLY STRL (GAUZE/BANDAGES/DRESSINGS) IMPLANT
GLOVE SURG ENC MOIS LTX SZ6 (GLOVE) ×3 IMPLANT
GLOVE SURG MICRO LTX SZ6 (GLOVE) ×3 IMPLANT
GLOVE SURG UNDER LTX SZ6.5 (GLOVE) ×3 IMPLANT
GOWN STRL REUS W/TWL LRG LVL3 (GOWN DISPOSABLE) ×3 IMPLANT
GOWN STRL REUS W/TWL XL LVL3 (GOWN DISPOSABLE) ×6 IMPLANT
GRASPER SUT TROCAR 14GX15 (MISCELLANEOUS) ×3 IMPLANT
KIT BASIN OR (CUSTOM PROCEDURE TRAY) ×3 IMPLANT
KIT TURNOVER KIT A (KITS) ×3 IMPLANT
MARKER SKIN DUAL TIP RULER LAB (MISCELLANEOUS) ×3 IMPLANT
MAT PREVALON FULL STRYKER (MISCELLANEOUS) ×3 IMPLANT
NDL SPNL 22GX3.5 QUINCKE BK (NEEDLE) ×2 IMPLANT
NEEDLE SPNL 22GX3.5 QUINCKE BK (NEEDLE) ×3 IMPLANT
PACK UNIVERSAL I (CUSTOM PROCEDURE TRAY) ×3 IMPLANT
RELOAD ENDO STITCH (ENDOMECHANICALS) IMPLANT
RELOAD STAPLE 60 3.6 BLU REG (STAPLE) ×2 IMPLANT
RELOAD STAPLE 60 3.8 GOLD REG (STAPLE) ×2 IMPLANT
RELOAD STAPLE 60 4.1 GRN THCK (STAPLE) IMPLANT
RELOAD STAPLER BLUE 60MM (STAPLE) ×6 IMPLANT
RELOAD STAPLER GOLD 60MM (STAPLE) ×2 IMPLANT
RELOAD STAPLER GREEN 60MM (STAPLE) ×2 IMPLANT
RELOAD SUT TRIPLE-STITCH 2-0 (ENDOMECHANICALS) IMPLANT
SCISSORS LAP 5X45 EPIX DISP (ENDOMECHANICALS) ×3 IMPLANT
SET IRRIG TUBING LAPAROSCOPIC (IRRIGATION / IRRIGATOR) ×3 IMPLANT
SET TUBE SMOKE EVAC HIGH FLOW (TUBING) ×3 IMPLANT
SHEARS HARMONIC ACE PLUS 45CM (MISCELLANEOUS) ×3 IMPLANT
SLEEVE ADV FIXATION 5X100MM (TROCAR) ×6 IMPLANT
SLEEVE GASTRECTOMY 40FR VISIGI (MISCELLANEOUS) ×3 IMPLANT
SOL ANTI FOG 6CC (MISCELLANEOUS) ×2 IMPLANT
SOLUTION ANTI FOG 6CC (MISCELLANEOUS) ×1
SPONGE T-LAP 18X18 ~~LOC~~+RFID (SPONGE) ×3 IMPLANT
STAPLER ECHELON BIOABSB 60 FLE (MISCELLANEOUS) ×3 IMPLANT
STAPLER ECHELON LONG 60 440 (INSTRUMENTS) ×3 IMPLANT
STAPLER RELOAD BLUE 60MM (STAPLE) ×9
STAPLER RELOAD GOLD 60MM (STAPLE) ×3
STAPLER RELOAD GREEN 60MM (STAPLE) ×3
STRIP CLOSURE SKIN 1/2X4 (GAUZE/BANDAGES/DRESSINGS) ×3 IMPLANT
SUT MNCRL AB 4-0 PS2 18 (SUTURE) ×3 IMPLANT
SUT SURGIDAC NAB ES-9 0 48 120 (SUTURE) IMPLANT
SUT VICRYL 0 TIES 12 18 (SUTURE) ×3 IMPLANT
SYR 10ML ECCENTRIC (SYRINGE) ×3 IMPLANT
SYR 20ML LL LF (SYRINGE) ×3 IMPLANT
SYR 50ML LL SCALE MARK (SYRINGE) ×3 IMPLANT
TOWEL OR 17X26 10 PK STRL BLUE (TOWEL DISPOSABLE) ×3 IMPLANT
TOWEL OR NON WOVEN STRL DISP B (DISPOSABLE) ×3 IMPLANT
TROCAR ADV FIXATION 5X100MM (TROCAR) ×3 IMPLANT
TROCAR BLADELESS 15MM (ENDOMECHANICALS) ×3 IMPLANT
TROCAR BLADELESS OPT 5 100 (ENDOMECHANICALS) ×3 IMPLANT
TUBING CONNECTING 10 (TUBING) ×3 IMPLANT
TUBING ENDO SMARTCAP (MISCELLANEOUS) ×3 IMPLANT

## 2021-06-26 NOTE — Anesthesia Postprocedure Evaluation (Signed)
Anesthesia Post Note  Patient: [name]  Procedure(s) Performed: LAPAROSCOPIC GASTRIC SLEEVE RESECTION (Abdomen) UPPER GI ENDOSCOPY     Patient location during evaluation: PACU Anesthesia Type: General Level of consciousness: awake and alert Pain management: pain level controlled Vital Signs Assessment: post-procedure vital signs reviewed and stable Respiratory status: spontaneous breathing, nonlabored ventilation, respiratory function stable and patient connected to nasal cannula oxygen Cardiovascular status: blood pressure returned to baseline and stable Postop Assessment: no apparent nausea or vomiting Anesthetic complications: no   No notable events documented.  Last Vitals:  Vitals:   06/26/21 1100 06/26/21 1207  BP: (!) 145/86 (!) 156/103  Pulse: 68 72  Resp: 14 18  Temp:  37.3 C  SpO2: 99% 98%    Last Pain:  Vitals:   06/26/21 1207  TempSrc: Oral  PainSc:                  Nelle Don Shalissa Easterwood

## 2021-06-26 NOTE — Anesthesia Procedure Notes (Signed)
Procedure Name: Intubation Date/Time: 06/26/2021 7:42 AM Performed by: Gean Maidens, CRNA Pre-anesthesia Checklist: Patient identified, Emergency Drugs available, Suction available, Patient being monitored and Timeout performed Patient Re-evaluated:Patient Re-evaluated prior to induction Oxygen Delivery Method: Circle system utilized Preoxygenation: Pre-oxygenation with 100% oxygen Induction Type: IV induction Ventilation: Mask ventilation without difficulty Laryngoscope Size: Mac and 4 Grade View: Grade I Tube type: Oral Tube size: 7.0 mm Number of attempts: 1 Airway Equipment and Method: Stylet Placement Confirmation: ETT inserted through vocal cords under direct vision, positive ETCO2 and breath sounds checked- equal and bilateral Secured at: 21 cm Tube secured with: Tape Dental Injury: Teeth and Oropharynx as per pre-operative assessment

## 2021-06-26 NOTE — Progress Notes (Signed)

## 2021-06-26 NOTE — Progress Notes (Signed)
PHARMACY CONSULT FOR:  Risk Assessment for Post-Discharge VTE Following Bariatric Surgery  Post-Discharge VTE Risk Assessment: This patient's probability of 30-day post-discharge VTE is increased due to the factors marked:   Female    Age >/=60 years    BMI >/=50 kg/m2    CHF    Dyspnea at Rest    Paraplegia  x  Non-gastric-band surgery    Operation Time >/=3 hr    Return to OR     Length of Stay >/= 3 d   Hx of VTE   Hypercoagulable condition   Significant venous stasis       Predicted probability of 30-day post-discharge VTE: 0.16%  Other patient-specific factors to consider:   Recommendation for Discharge: No pharmacologic prophylaxis post-discharge    Laura Gross is a 35 y.o. female who underwent laparoscopic sleeve gastrectomy on 8/29   Case start: 0744 Case end: 0835   Allergies  Allergen Reactions   Penicillins Hives    Patient Measurements: Weight: 108.3 kg (238 lb 12.8 oz) Body mass index is 39.74 kg/m.  No results for input(s): WBC, HGB, HCT, PLT, APTT, CREATININE, LABCREA, CREATININE, CREAT24HRUR, MG, PHOS, ALBUMIN, PROT, ALBUMIN, AST, ALT, ALKPHOS, BILITOT, BILIDIR, IBILI in the last 72 hours. Estimated Creatinine Clearance: 122.4 mL/min (by C-G formula based on SCr of 0.61 mg/dL).    Past Medical History:  Diagnosis Date   Chicken pox    Family history of adverse reaction to anesthesia    ponv mother   Headache    History of UTI    Left supracondylar humerus fracture 03/04/2020   Migraine      No medications prior to admission.       Berkley Harvey 06/26/2021,10:19 AM

## 2021-06-26 NOTE — Op Note (Signed)
Operative Note  Annalysia Willenbring  [id number]  [id number]  06/26/2021   Surgeon: Phylliss Blakes MD   Assistant: Feliciana Rossetti MD   Procedure performed: laparoscopic sleeve gastrectomy, upper endoscopy   Preop diagnosis: Morbid obesity Body mass index is 39.74 kg/m. Post-op diagnosis/intraop findings: same   Specimens: fundus Retained items: none  EBL: minimal  Complications: none   Description of procedure: After obtaining informed consent and administration of chemical DVT prophylaxis in holding, the patient was taken to the operating room and placed supine on operating room table where general endotracheal anesthesia was initiated, preoperative antibiotics were administered, SCDs applied, and a formal timeout was performed. The abdomen was prepped and draped in usual sterile fashion. Peritoneal access was gained using a Visiport technique in the left upper quadrant and insufflation to 15 mmHg ensued without issue. Gross inspection revealed no evidence of injury.  Under direct visualization three more 5 mm trochars were placed in the right and left hemiabdomen and the 89mm trocar in the right paramedian upper abdomen. Bilateral laparoscopic assisted TAPS blocks were performed with Exparel diluted with 0.25 percent Marcaine with epinephrine. The patient was placed in steep Trendelenburg and the liver retractor was introduced through an incision in the upper midline and secured to the post externally to maintain the left lobe retracted anteriorly.  There was no hiatal hernia on direct inspection. Using the Harmonic scalpel, the greater curvature of the stomach was dissected away from the greater omentum and short gastric vessels were divided. This began 6 cm from the pylorus, and dissection proceeded until the left crus was clearly exposed. The 52 [place] VisiGi was then introduced and directed down towards the pylorus. This was placed to suction against the lesser curve. Serial fires of the  linear cutting stapler were then employed to create our sleeve. The first fire used a green load with seamguard and ensured adequate room at the angularis incisura. One gold load and then several blue loads were then employed to create a narrow tubular stomach up to the angle of His. The excised stomach was then removed through our 15 mm trocar site within an Endo Catch bag.  The visigi was taken off of suction and a few puffs of air were introduced, inflating the sleeve. No bubbles were observed in the irrigation fluid around the stomach and the shape was noted to be evenly tubular without any narrowing at the angularis. The visigi was then removed. Upper endoscopy was performed by the assistant surgeon and the sleeve was noted to be airtight, the staple line was hemostatic. Please see his separate note. The endoscope was removed. A small amount of oozing on the staple line was addressed with clips. The 15 mm trocar site fascia in the right upper abdomen was closed with a 0 Vicryl using the laparoscopic suture passer under direct visualization. The liver retractor was removed under direct visualization. The abdomen was then desufflated and all remaining trochars removed. The skin incisions were closed with subcuticular 4-0 Monocryl; benzoin, Steri-Strips and Band-Aids were applied The patient was then awakened, extubated and taken to PACU in stable condition.     All counts were correct at the completion of the case.

## 2021-06-26 NOTE — Interval H&P Note (Signed)
History and Physical Interval Note:  06/26/2021 7:01 AM  Laura Gross  has presented today for surgery, with the diagnosis of MORBID OBESITY.  The various methods of treatment have been discussed with the patient and family. After consideration of risks, benefits and other options for treatment, the patient has consented to  Procedure(s): LAPAROSCOPIC GASTRIC SLEEVE RESECTION (N/A) UPPER GI ENDOSCOPY (N/A) as a surgical intervention.  The patient's history has been reviewed, patient examined, no change in status, stable for surgery.  I have reviewed the patient's chart and labs.  Questions were answered to the patient's satisfaction.     Glorian Mcdonell Lollie Sails

## 2021-06-26 NOTE — Op Note (Signed)
Preoperative diagnosis: laparoscopic sleeve gastrectomy  Postoperative diagnosis: Same   Procedure: Upper endoscopy   Surgeon: Feliciana Rossetti, M.D.  Anesthesia: Gen.   Indications for procedure: This patient was undergoing a laparoscopic sleeve gastrectomy.   Description of procedure: The endoscopy was placed in the mouth and into the oropharynx and under endoscopic vision it was advanced to the esophagogastric junction.  The stomach was insufflated and no bleeding or bubbles were seen.  The GEJ was identified at 37cm from the teeth. No bleeding or leaks were detected. The scope was withdrawn without difficulty.    Feliciana Rossetti, M.D. General, Bariatric, & Minimally Invasive Surgery [facility] [place]

## 2021-06-26 NOTE — Transfer of Care (Signed)
Immediate Anesthesia Transfer of Care Note  Patient: Laura Gross  Procedure(s) Performed: LAPAROSCOPIC GASTRIC SLEEVE RESECTION (Abdomen) UPPER GI ENDOSCOPY  Patient Location: PACU  Anesthesia Type:General  Level of Consciousness: sedated, patient cooperative and responds to stimulation  Airway & Oxygen Therapy: Patient Spontanous Breathing and Patient connected to face mask oxygen  Post-op Assessment: Report given to RN and Post -op Vital signs reviewed and stable  Post vital signs: Reviewed and stable  Last Vitals:  Vitals Value Taken Time  BP 118/76 06/26/21 0848  Temp    Pulse 69 06/26/21 0849  Resp 23 06/26/21 0849  SpO2 100 % 06/26/21 0849  Vitals shown include unvalidated device data.  Last Pain:  Vitals:   06/26/21 0635  TempSrc:   PainSc: 0-No pain         Complications: No notable events documented.

## 2021-06-27 ENCOUNTER — Encounter (HOSPITAL_COMMUNITY): Payer: Self-pay | Admitting: Surgery

## 2021-06-27 DIAGNOSIS — Z6841 Body Mass Index (BMI) 40.0 and over, adult: Secondary | ICD-10-CM | POA: Diagnosis not present

## 2021-06-27 LAB — CBC WITH DIFFERENTIAL/PLATELET
Abs Immature Granulocytes: 0.01 10*3/uL (ref 0.00–0.07)
Basophils Absolute: 0 10*3/uL (ref 0.0–0.1)
Basophils Relative: 0 %
Eosinophils Absolute: 0.2 10*3/uL (ref 0.0–0.5)
Eosinophils Relative: 2 %
HCT: 32.9 % — ABNORMAL LOW (ref 36.0–46.0)
Hemoglobin: 11.5 g/dL — ABNORMAL LOW (ref 12.0–15.0)
Immature Granulocytes: 0 %
Lymphocytes Relative: 36 %
Lymphs Abs: 2.8 10*3/uL (ref 0.7–4.0)
MCH: 29.6 pg (ref 26.0–34.0)
MCHC: 35 g/dL (ref 30.0–36.0)
MCV: 84.8 fL (ref 80.0–100.0)
Monocytes Absolute: 0.5 10*3/uL (ref 0.1–1.0)
Monocytes Relative: 7 %
Neutro Abs: 4.3 10*3/uL (ref 1.7–7.7)
Neutrophils Relative %: 55 %
Platelets: 215 10*3/uL (ref 150–400)
RBC: 3.88 MIL/uL (ref 3.87–5.11)
RDW: 13 % (ref 11.5–15.5)
WBC: 7.8 10*3/uL (ref 4.0–10.5)
nRBC: 0 % (ref 0.0–0.2)

## 2021-06-27 LAB — SURGICAL PATHOLOGY

## 2021-06-27 LAB — COMPREHENSIVE METABOLIC PANEL
ALT: 32 U/L (ref 0–44)
AST: 38 U/L (ref 15–41)
Albumin: 3.7 g/dL (ref 3.5–5.0)
Alkaline Phosphatase: 52 U/L (ref 38–126)
Anion gap: 6 (ref 5–15)
BUN: 7 mg/dL (ref 6–20)
CO2: 22 mmol/L (ref 22–32)
Calcium: 8.6 mg/dL — ABNORMAL LOW (ref 8.9–10.3)
Chloride: 111 mmol/L (ref 98–111)
Creatinine, Ser: 0.47 mg/dL (ref 0.44–1.00)
GFR, Estimated: >60 mL/min (ref >60)
Glucose, Bld: 97 mg/dL (ref 70–99)
Potassium: 3.5 mmol/L (ref 3.5–5.1)
Sodium: 139 mmol/L (ref 135–145)
Total Bilirubin: 0.7 mg/dL (ref 0.3–1.2)
Total Protein: 6.3 g/dL — ABNORMAL LOW (ref 6.5–8.1)

## 2021-06-27 LAB — MAGNESIUM: Magnesium: 2.1 mg/dL (ref 1.7–2.4)

## 2021-06-27 MED ORDER — TRAMADOL HCL 50 MG PO TABS: 50.0000 mg | ORAL_TABLET | Freq: Four times a day (QID) | ORAL | 0 refills | Status: DC | PRN

## 2021-06-27 MED ORDER — GABAPENTIN 100 MG PO CAPS: 200.0000 mg | ORAL_CAPSULE | Freq: Two times a day (BID) | ORAL | 0 refills | Status: DC

## 2021-06-27 MED ORDER — PANTOPRAZOLE SODIUM 40 MG PO TBEC: 40.0000 mg | DELAYED_RELEASE_TABLET | Freq: Every day | ORAL | 0 refills | Status: DC

## 2021-06-27 MED ORDER — ACETAMINOPHEN 500 MG PO TABS: 1000.0000 mg | ORAL_TABLET | Freq: Three times a day (TID) | ORAL | 0 refills | Status: AC

## 2021-06-27 MED ORDER — ONDANSETRON 4 MG PO TBDP: 4.0000 mg | ORAL_TABLET | Freq: Four times a day (QID) | ORAL | 0 refills | Status: DC | PRN

## 2021-06-27 MED ORDER — POTASSIUM CHLORIDE CRYS ER 20 MEQ PO TBCR
40.0000 meq | EXTENDED_RELEASE_TABLET | Freq: Once | ORAL | Status: AC
  Administered 2021-06-27: 40 meq via ORAL
  Filled 2021-06-27: qty 2

## 2021-06-27 NOTE — Progress Notes (Signed)
S: Pain is well controlled.  Reports some nausea.  She has not really required any PRNs for pain or nausea overnight.  Tolerating sips of clears and has started protein.  Walking in the halls.  O: Vitals, labs, intake/output, and orders reviewed at this time.  Afebrile, no tachycardia, mildly hypertensive, sats 100% on room air.  P.o. 200, urine output 4100.  CMP-potassium 3.5, otherwise unremarkable.  CBC-White count 7.8 (7.0 preop), hemoglobin 11.5 (13.5), platelets 215 (237)   Gen: A&Ox3, no distress  H&N: EOMI, atraumatic, neck supple Chest: unlabored respirations, RRR Abd: soft, nontender, nondistended, incision(s) c/d/i with Steri-Strips, no cellulitis or hematoma Ext: warm, no edema Neuro: grossly normal  Lines/tubes/drains: piv  A/P: Postop day 1 status post laparoscopic sleeve gastrectomy -Continue clear liquids and protein shakes -Continue Lovenox, SCDs, ambulation, aggressive pulmonary toilet -Plan discharge later today if continuing to progress well.   Phylliss Blakes, MD [facility], [place]

## 2021-06-27 NOTE — Discharge Instructions (Signed)

## 2021-06-27 NOTE — Progress Notes (Signed)
Discharge instructions given to patient and all questions were answered.  

## 2021-06-27 NOTE — Progress Notes (Signed)
Patient alert and oriented, Post op day 1.  Provided support and encouragement.  Encouraged pulmonary toilet, ambulation and small sips of liquids.  All questions answered.  Will continue to monitor. 

## 2021-06-27 NOTE — Progress Notes (Signed)
Patient alert and oriented, pain is controlled. Patient is tolerating fluids, advanced to protein shake today, patient is tolerating well.  Reviewed Gastric sleeve discharge instructions with patient and patient is able to articulate understanding.  Provided information on BELT program, Support Group and [facility] outpatient pharmacy. All questions answered, will continue to monitor.  

## 2021-06-27 NOTE — Discharge Summary (Signed)
Physician Discharge Summary  Laura Gross QIW:[id number] DOB: [birth year] DOA: 06/26/2021  PCP: Pleas Koch, NP  Admit date: 06/26/2021 Discharge date: 06/27/2021  Recommendations for Outpatient Follow-up:    Follow-up Information     Clovis Riley, MD. Go on 07/21/2021.   Specialty: General Surgery Why: at 2:20pm.  Please arrive 15 minutes prior to your appointment time.  Thank you. Contact information: [address] [place] [address] [phone]         Carlena Hurl, PA-C. Go on 08/24/2021.   Specialty: General Surgery Why: at 9am for Dr. Kae Heller.  Please arrive 15 minutes prior to your appointment time.  Thank you. Contact information: [place] [address] [phone]                Discharge Diagnoses:  Active Problems:   Morbid obesity ([place])   Surgical Procedure: Laparoscopic Sleeve Gastrectomy, upper endoscopy  Discharge Condition: Good Disposition: Home  Diet recommendation: Postoperative sleeve gastrectomy diet (liquids only)  Filed Weights   06/26/21 0601  Weight: 108.3 kg     Hospital Course:  The patient was admitted for a planned laparoscopic sleeve gastrectomy. Please see operative note. Preoperatively the patient was given 5000 units of subcutaneous heparin for DVT prophylaxis. Postoperative prophylactic Lovenox dosing was started on the evening of postoperative day 0. ERAS protocol was used. On the evening of postoperative day 0, the patient was started on water and ice chips. On postoperative day 1 the patient had no fever or tachycardia and was tolerating water in their diet was gradually advanced throughout the day. The patient was ambulating without difficulty. Their vital signs are stable without fever or tachycardia. Their hemoglobin had remained stable. The patient had received discharge instructions and counseling. They were deemed stable for discharge and had met  discharge criteria   Discharge Instructions   Allergies as of 06/27/2021       Reactions   Penicillins Hives        Medication List     TAKE these medications    acetaminophen 500 MG tablet Commonly known as: TYLENOL Take 2 tablets (1,000 mg total) by mouth every 8 (eight) hours for 5 days.   gabapentin 100 MG capsule Commonly known as: NEURONTIN Take 2 capsules (200 mg total) by mouth every 12 (twelve) hours.   ondansetron 4 MG disintegrating tablet Commonly known as: ZOFRAN-ODT Take 1 tablet (4 mg total) by mouth every 6 (six) hours as needed for nausea or vomiting.   pantoprazole 40 MG tablet Commonly known as: PROTONIX Take 1 tablet (40 mg total) by mouth daily.   traMADol 50 MG tablet Commonly known as: ULTRAM Take 1 tablet (50 mg total) by mouth every 6 (six) hours as needed (pain).        Follow-up Information     Clovis Riley, MD. Go on 07/21/2021.   Specialty: General Surgery Why: at 2:20pm.  Please arrive 15 minutes prior to your appointment time.  Thank you. Contact information: [address] [place] [address] [phone]         Carlena Hurl, PA-C. Go on 08/24/2021.   Specialty: General Surgery Why: at 9am for Dr. Kae Heller.  Please arrive 15 minutes prior to your appointment time.  Thank you. Contact information: [place] [address] [phone]                  The results of  significant diagnostics from this hospitalization (including imaging, microbiology, ancillary and laboratory) are listed below for reference.    Significant Diagnostic Studies: No results found.  Labs: Basic Metabolic Panel: Recent Labs  Lab 06/27/21 0416  NA 139  K 3.5  CL 111  CO2 22  GLUCOSE 97  BUN 7  CREATININE 0.47  CALCIUM 8.6*  MG 2.1   Liver Function Tests: Recent Labs  Lab 06/27/21 0416  AST 38  ALT 32  ALKPHOS 52  BILITOT 0.7  PROT 6.3*  ALBUMIN 3.7    CBC: Recent  Labs  Lab 06/27/21 0416  WBC 7.8  NEUTROABS 4.3  HGB 11.5*  HCT 32.9*  MCV 84.8  PLT 215    CBG: No results for input(s): GLUCAP in the last 168 hours.  Active Problems:   Morbid obesity ([place])    Signed:  [facility], [place] 06/27/2021, 8:07 PM

## 2021-06-27 NOTE — Progress Notes (Signed)
Nutrition Education Note ° °Received consult for diet education for patient s/p bariatric surgery. ° °Discussed 2 week post op diet with pt. Emphasized that liquids must be non carbonated, non caffeinated, and sugar free. Fluid goals discussed. Pt to follow up with outpatient bariatric RD for further diet progression after 2 weeks. Multivitamins and minerals also reviewed. Teach back method used, pt expressed understanding, expect good compliance. ° °If nutrition issues arise, please consult RD. ° °Dillinger Aston, MS, RD, LDN °Inpatient Clinical Dietitian °Contact information available via Amion ° ° °

## 2021-06-27 NOTE — Progress Notes (Signed)
24hr fluid recall prior to discharge: (flowsheet + of water) -- .  Per dehydration protocol, will call pt to f/u within one week post op.

## 2021-06-30 ENCOUNTER — Telehealth (HOSPITAL_COMMUNITY): Payer: Self-pay | Admitting: *Deleted

## 2021-06-30 NOTE — Telephone Encounter (Signed)
1.  Tell me about your pain and pain management? Pt denies any current pain.  2.  Let's talk about fluid intake.  How much total fluid are you taking in? Pt states that she is getting in at least 64oz of fluid including protein shakes, bottled water, Gatorade Zero and bone broth.  3.  How much protein have you taken in the last 2 days? Pt states that she is working to meet goal of goal of 60g of protein today.  Pt states that is able to consume 1.5 protein shakes a day along with bone broth.  Discussed with pt options to try and meet protein goal daily.  4.  Have you had nausea?  Tell me about when have experienced nausea and what you did to help? Pt denies current nausea.  Pt states that MVI made her nauseated.  Discussed options to mitigate nausea and take MVI daily.   5.  Has the frequency or color changed with your urine? Pt states that she is urinating "fine" with no changes in frequency or urgency.     6.  Tell me what your incisions look like? "Incisions look fine". Pt denies a fever, chills.  Pt states incisions are not swollen, open, or draining.  Pt encouraged to call [facility] if incisions change.   7.  Have you been passing gas? BM? Pt states that she is having BMs. Last BM 06/30/21.    8.  If a problem or question were to arise who would you call?  Do you know contact numbers for [facility], [facility], and [facility]? Pt denies dehydration symptoms.  Pt can describe s/sx of dehydration.  Pt knows to call [facility] for surgical, [facility] for nutrition, and [facility] for non-urgent questions or concerns.   9.  How has the walking going? Pt states she is walking around and able to be active without difficulty.   10. Are you still using your incentive spirometer?  If so, how often? Pt states that she is using it some throughout the day. Pt encouraged to use incentive spirometer, at least 10x every hour while awake until she sees the surgeon.  11.  How are your vitamins and calcium going?  How are you taking them? Pt  states that she is taking her calcium without difficulty. Discussed options for MVI, see question #4.    Reminded patient that the first 30 days post-operatively are important for successful recovery.  Practice good hand hygiene, wearing a mask when appropriate (since optional in most places), and minimizing exposure to people who live outside of the home, especially if they are exhibiting any respiratory, GI, or illness-like symptoms.

## 2021-07-07 ENCOUNTER — Encounter: Payer: Self-pay | Admitting: Dietician

## 2021-07-07 ENCOUNTER — Encounter: Payer: BC Managed Care – PPO | Attending: Surgery | Admitting: Dietician

## 2021-07-07 ENCOUNTER — Other Ambulatory Visit: Payer: Self-pay

## 2021-07-07 VITALS — Ht 66.0 in | Wt 221.6 lb

## 2021-07-07 DIAGNOSIS — E669 Obesity, unspecified: Secondary | ICD-10-CM

## 2021-07-07 DIAGNOSIS — Z6835 Body mass index (BMI) 35.0-35.9, adult: Secondary | ICD-10-CM | POA: Diagnosis not present

## 2021-07-07 NOTE — Progress Notes (Signed)
Nutrition Therapy for Post-Operative Bariatric Diet Follow-up visit:  2 weeks post-op sleeve gastrectomy Surgery  Medical Nutrition Therapy:  Appt start time: 1115 end time:  1145  Anthropometrics: Weight: 221.6lbs Height: 5'6"  Date 04/10/21 06/05/21 07/07/21  BMI 39.24 38.58 35.8  Weight (lbs) 243.1 239 221.6  Skeletal muscle (lbs)   62  % body fat   50.1   Clinical: Medications: pantoprazole Supplementation: Tums calcium supplement 2x daily 750mg  Health/ medical history changes: no GI symptoms: N/V/D/C: nausea and vomiting after chewable vitamins Dumping Syndrome: no Hair loss: none more than normal  Dietary/ Lifestyle Progress: Patient reports no significant issues during or since surgery.  She tried 2-3 types of bariatric vitamins but had GI distress with each; has stopped multivitamin until she can find a suitable option. She is growing weary of almost all foods and drinks tasting sweet. Has been participating in online bariatric support group. She is using [employer] app to monitor intake and learn ideas for appropriate foods and beverages.   Dietary recall: Eating pattern: 3 or more high protein beverages daily Dining out: 0 Breakfast: protein water Snack: Premier protein shake  Lunch: strained soup with added protein powder Snack: sugar free fluids  Dinner: milk with peanut powder or shake Snack: sugar free fluids  Fluid intake: 70-80oz daily Estimated total protein intake: 60g + daily Bariatric diet adherence:  Using straws: no Drinking fluids during meals: n/a Carbonated beverages: no  Recent physical activity:  short walks at home throughout day; longer walks 3-4 times a week   Nutrition Intervention:   Reviewed progress since surgery Instructed on advancement of diet to include solid protein foods, low fat, moist and tender options Reviewed importance of small bites, chewing thoroughly, avoiding fluids during and shortly after meals, keeping all foods and  beverages low in sugar/ carbs and fats. Discussed inclusion of low carb vegetables only when able to eat 1-2oz protein food first.  Encouraged gradual weaning off regular consumption of protein shakes as intake of solid protein increases.   Nutritional Diagnosis:  Ross-3.3 Overweight/obesity As related to history of excess calories and inadequate physical activity, and pregnancy weight gain.  As evidenced by patient with current BMI of 35.8, following bariatric diet guidelines after sleeve gastrectomy.  Teaching Method Utilized:  Visual Auditory Hands on  Materials provided: Phase 3 and 4 bariatric diet handouts  Learning Readiness:  Change in progress  Barriers to learning/adherence to lifestyle change: none  Demonstrated degree of understanding via:  Teach Back      Plan: Advance diet to include solid protein foods, as directed in patient instructions Return for follow up MNT 08/25/21

## 2021-07-07 NOTE — Patient Instructions (Signed)
Begin eating some solid protein foods -- tender, moist, small bites, chew thoroughly.  Allow 15-30 minutes to eat a solid meal, leave what you are unable to finish in that time frame, and wait 2-3 hours before eating another meal or snack. Continue with regular exercise as tolerated and cleared by surgeon.

## 2021-08-25 ENCOUNTER — Ambulatory Visit: Payer: BC Managed Care – PPO | Admitting: Dietician

## 2021-11-01 ENCOUNTER — Encounter: Payer: Self-pay | Admitting: Dietician

## 2021-11-01 NOTE — Progress Notes (Signed)
Patient cancelled her MNT appointment on 08/25/21 due to lack of insurance coverage. She has not reschedule. Sent notification to referring provider.

## 2021-12-22 DIAGNOSIS — Z903 Acquired absence of stomach [part of]: Secondary | ICD-10-CM | POA: Insufficient documentation

## 2021-12-26 ENCOUNTER — Encounter: Payer: Self-pay | Admitting: Primary Care

## 2021-12-26 ENCOUNTER — Ambulatory Visit (INDEPENDENT_AMBULATORY_CARE_PROVIDER_SITE_OTHER): Payer: BC Managed Care – PPO | Admitting: Primary Care

## 2021-12-26 ENCOUNTER — Other Ambulatory Visit: Payer: Self-pay

## 2021-12-26 DIAGNOSIS — Z903 Acquired absence of stomach [part of]: Secondary | ICD-10-CM | POA: Diagnosis not present

## 2021-12-26 DIAGNOSIS — F411 Generalized anxiety disorder: Secondary | ICD-10-CM

## 2021-12-26 DIAGNOSIS — Z1321 Encounter for screening for nutritional disorder: Secondary | ICD-10-CM | POA: Diagnosis not present

## 2021-12-26 DIAGNOSIS — G43901 Migraine, unspecified, not intractable, with status migrainosus: Secondary | ICD-10-CM

## 2021-12-26 NOTE — Assessment & Plan Note (Signed)
Controlled.  No concerns today. Continue to monitor.  

## 2021-12-26 NOTE — Assessment & Plan Note (Signed)
Weight loss of 71 pounds since August 2022! Encouraged that she continue to work on weight loss efforts!  Will await lab results from bariatric surgeon.

## 2021-12-26 NOTE — Progress Notes (Signed)
Subjective:    Patient ID: Laura Gross, female    DOB: May 22, 1986, 36 y.o.   MRN: [id number]  HPI  Laura Gross is a very pleasant 36 y.o. female who presents today for follow up post bariatric surgery and for foster care form completion.  She is status post laparoscopic sleeve gastrectomy from August 2022 per Dr. Doylene Canard. Uneventful surgery and hospitalization. Since her surgery she's feeling much better. She's lost 71 pounds.  She's had no complications since her surgery. She is scheduled today for her 6 month follow up.   She is due for foster care related and seen.  She has a form with her today that requires completion.   Wt Readings from Last 3 Encounters:  12/26/21 167 lb (75.8 kg)  07/07/21 221 lb 9.6 oz (100.5 kg)  06/26/21 238 lb 12.8 oz (108.3 kg)     Review of Systems  Respiratory:  Negative for shortness of breath.   Cardiovascular:  Negative for chest pain.  Neurological:  Negative for dizziness and headaches.  Psychiatric/Behavioral:  The patient is not nervous/anxious.         Past Medical History:  Diagnosis Date   Chicken pox    Family history of adverse reaction to anesthesia    ponv mother   Headache    History of UTI    Left supracondylar humerus fracture 03/04/2020   Migraine     Social History   Socioeconomic History   Marital status: Married    Spouse name: Not on file   Number of children: Not on file   Years of education: Not on file   Highest education level: Not on file  Occupational History   Not on file  Tobacco Use   Smoking status: Never   Smokeless tobacco: Never  Vaping Use   Vaping Use: Never used  Substance and Sexual Activity   Alcohol use: Yes    Comment: occasional   Drug use: No   Sexual activity: Yes  Other Topics Concern   Not on file  Social History Narrative   Not on file   Social Determinants of Health   Financial Resource Strain: Not on file  Food Insecurity: Not on file   Transportation Needs: Not on file  Physical Activity: Not on file  Stress: Not on file  Social Connections: Not on file  Intimate Partner Violence: Not on file    Past Surgical History:  Procedure Laterality Date   APPENDECTOMY  yrs ago   FEMUR SURGERY Left    LAPAROSCOPIC GASTRIC SLEEVE RESECTION N/A 06/26/2021   Procedure: LAPAROSCOPIC GASTRIC SLEEVE RESECTION;  Surgeon: Berna Bue, MD;  Location: [facility];  Service: General;  Laterality: N/A;   ORIF HUMERUS FRACTURE Left 03/08/2020   Procedure: OPEN REDUCTION INTERNAL FIXATION (ORIF) HUMERAL SHAFT FRACTURE;  Surgeon: Sheral Apley, MD;  Location: [facility];  Service: Orthopedics;  Laterality: Left;   SPONTANEOUS HIP DISLOCATION REPAIR Left    TONSILLECTOMY  2008 or 2009   UPPER GI ENDOSCOPY N/A 06/26/2021   Procedure: UPPER GI ENDOSCOPY;  Surgeon: Berna Bue, MD;  Location: [facility];  Service: General;  Laterality: N/A;    Family History  Problem Relation Age of Onset   Diabetes Paternal Grandmother    Cancer Paternal Grandmother        lung   Hyperlipidemia Mother    Arthritis Mother    Cancer Maternal Grandmother        breast   Cancer Maternal  Grandfather        nelanoma    Allergies  Allergen Reactions   Penicillins Hives    No current outpatient medications on file prior to visit.   No current facility-administered medications on file prior to visit.    BP 130/62    Pulse 63    Temp 98.3 F (36.8 C) (Oral)    Ht 5\' 6"  (1.676 m)    Wt 167 lb (75.8 kg)    SpO2 98%    BMI 26.95 kg/m  Objective:   Physical Exam Cardiovascular:     Rate and Rhythm: Normal rate and regular rhythm.  Pulmonary:     Effort: Pulmonary effort is normal.     Breath sounds: Normal breath sounds.  Musculoskeletal:     Cervical back: Neck supple.  Skin:    General: Skin is warm and dry.  Psychiatric:        Mood and Affect: Mood normal.          Assessment & Plan:      This visit occurred  during the SARS-CoV-2 public health emergency.  Safety protocols were in place, including screening questions prior to the visit, additional usage of staff PPE, and extensive cleaning of exam room while observing appropriate contact time as indicated for disinfecting solutions.

## 2021-12-26 NOTE — Assessment & Plan Note (Signed)
No recent migraines. Continue to monitor.  

## 2021-12-26 NOTE — Patient Instructions (Signed)
It was a pleasure to see you today!   

## 2022-03-01 ENCOUNTER — Encounter: Payer: BC Managed Care – PPO | Admitting: Primary Care

## 2022-06-28 DIAGNOSIS — Z008 Encounter for other general examination: Secondary | ICD-10-CM | POA: Diagnosis not present

## 2022-06-28 DIAGNOSIS — Z9884 Bariatric surgery status: Secondary | ICD-10-CM | POA: Diagnosis not present

## 2022-06-28 DIAGNOSIS — Z09 Encounter for follow-up examination after completed treatment for conditions other than malignant neoplasm: Secondary | ICD-10-CM | POA: Diagnosis not present

## 2022-06-28 IMAGING — RF DG HYSTEROGRAM
1 series · 6 of 6 positions shown · IV contrast (omnipaque)
Comparison: 04/23/2005 CT abdomen/pelvis.

CLINICAL DATA: Secondary female infertility.

EXAM:
HYSTEROSALPINGOGRAM
TECHNIQUE: Following cleansing of the cervix and vagina with Betadine solution,
a hysterosalpingogram was performed using a 5-French
hysterosalpingogram catheter and Omnipaque 300 contrast. The patient
tolerated the examination without difficulty.

[Series 1: one shot · 6 of 6 slices shown]
[im 1/6]
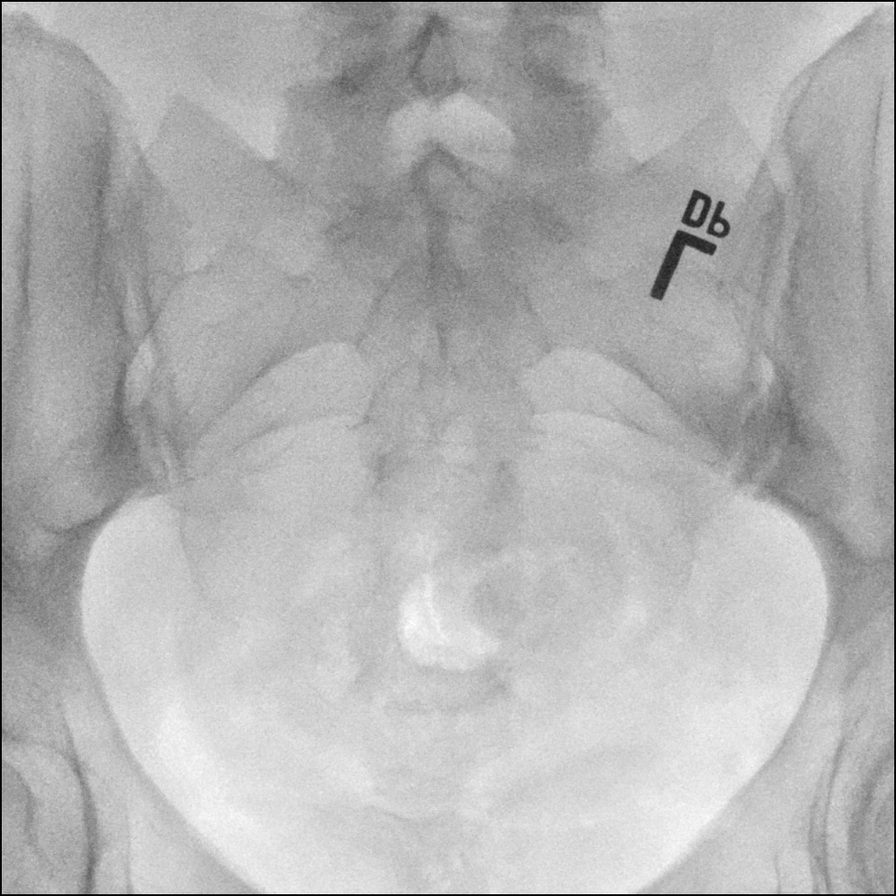
[im 2/6]
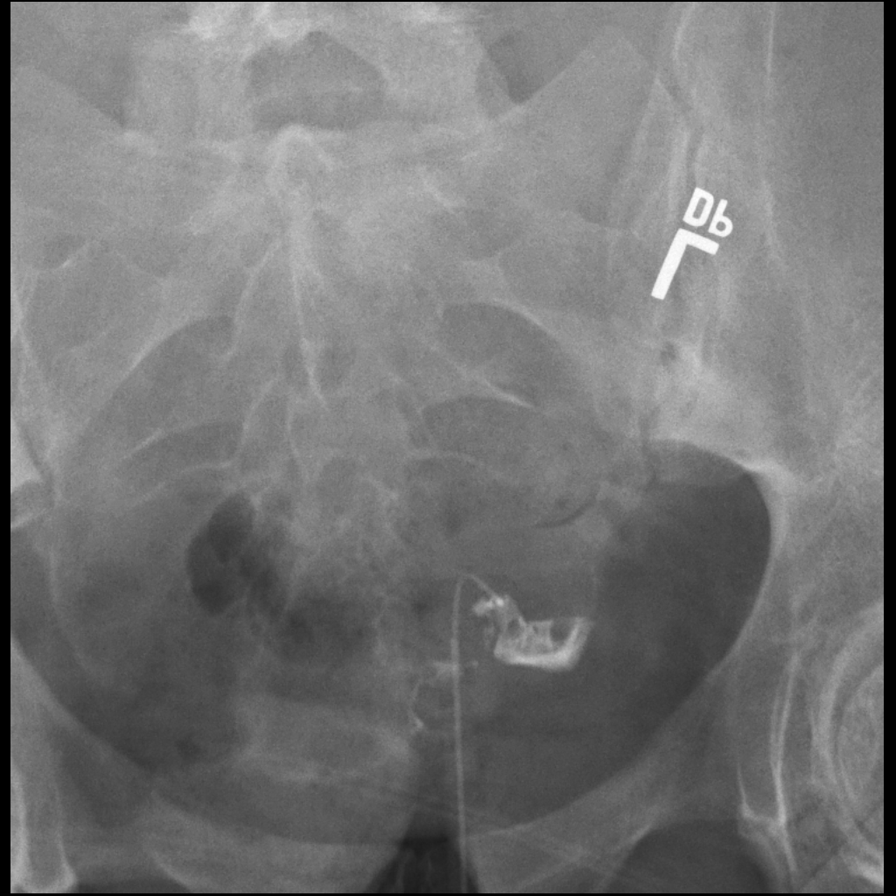
[im 3/6]
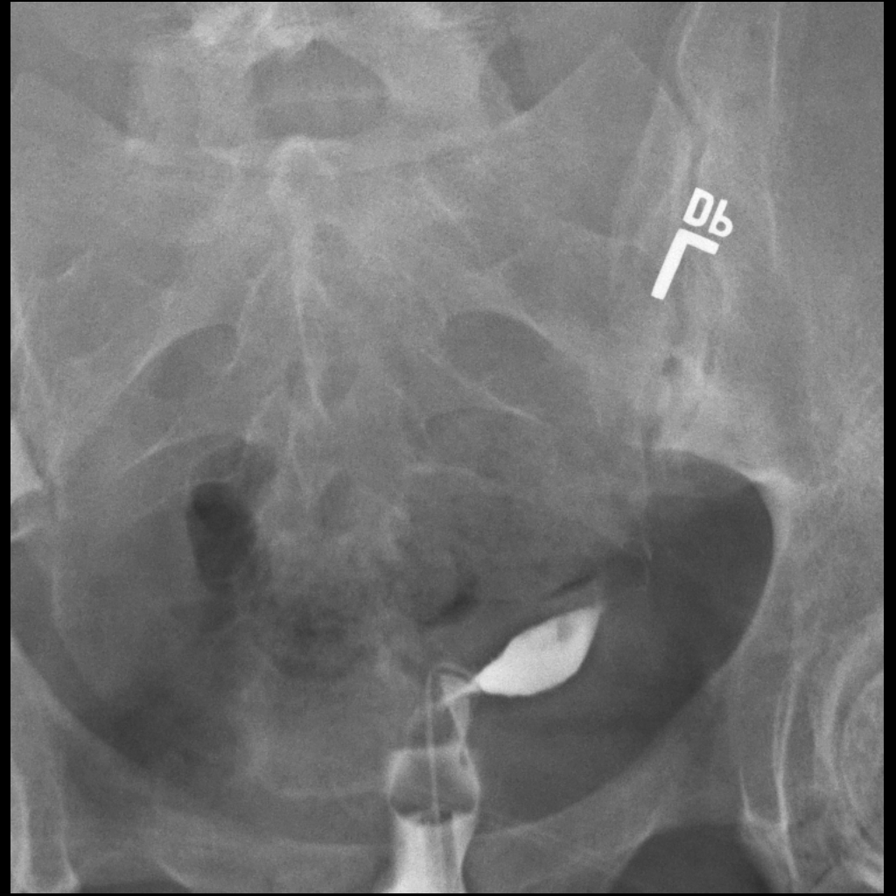
[im 4/6]
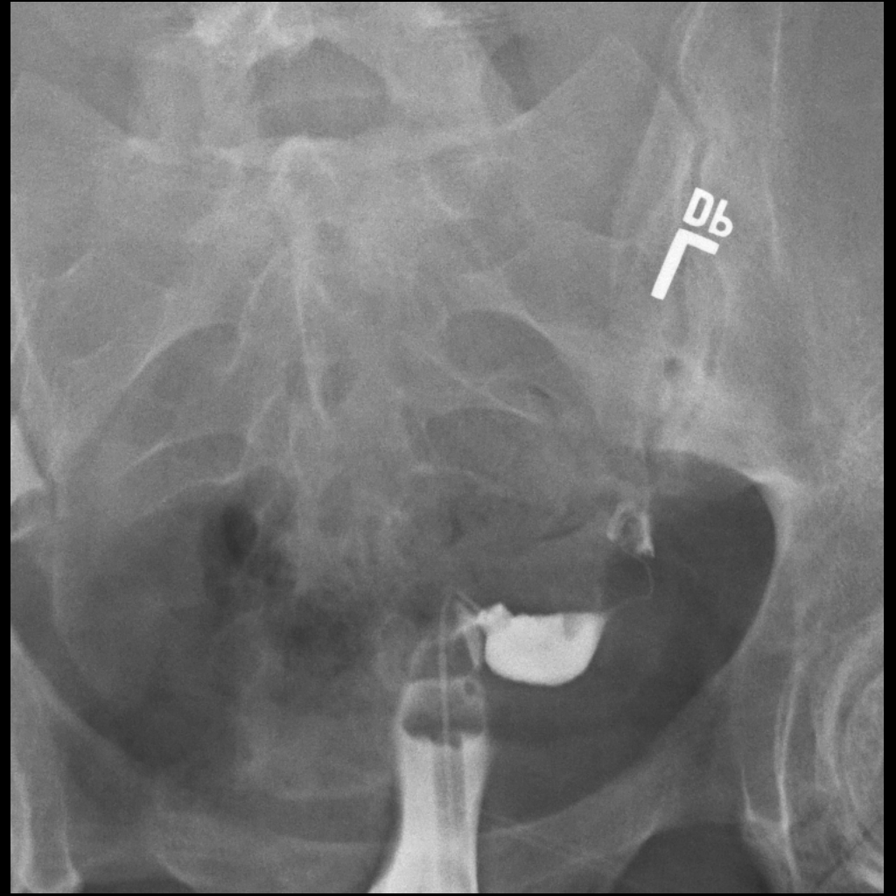
[im 5/6]
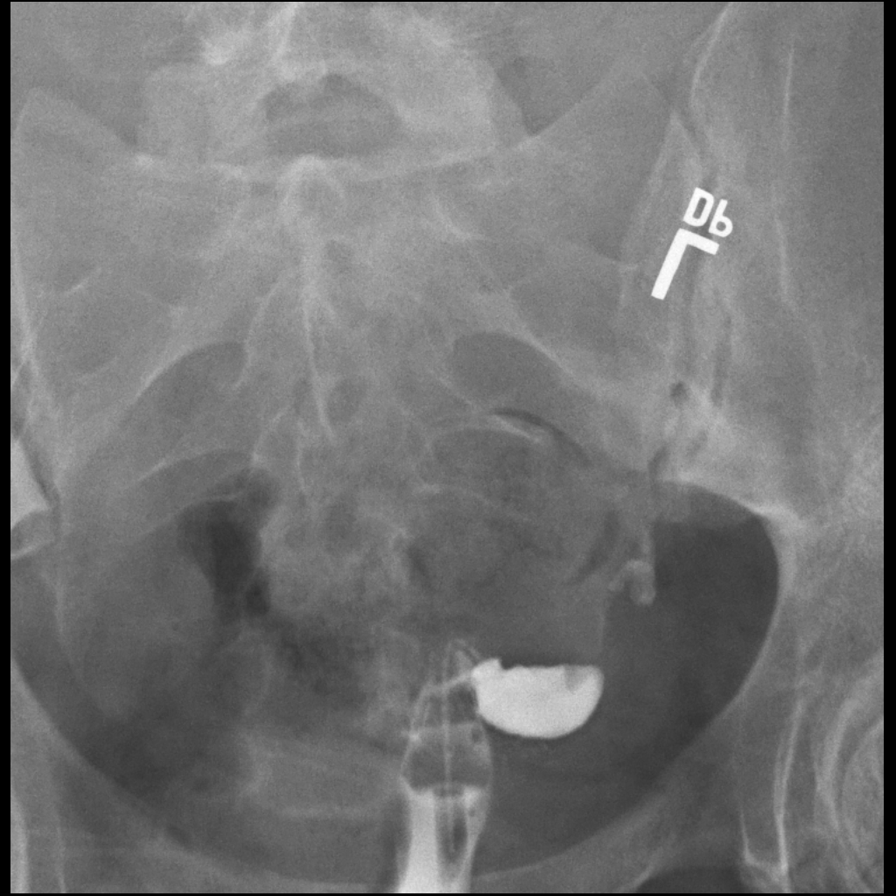
[im 6/6]
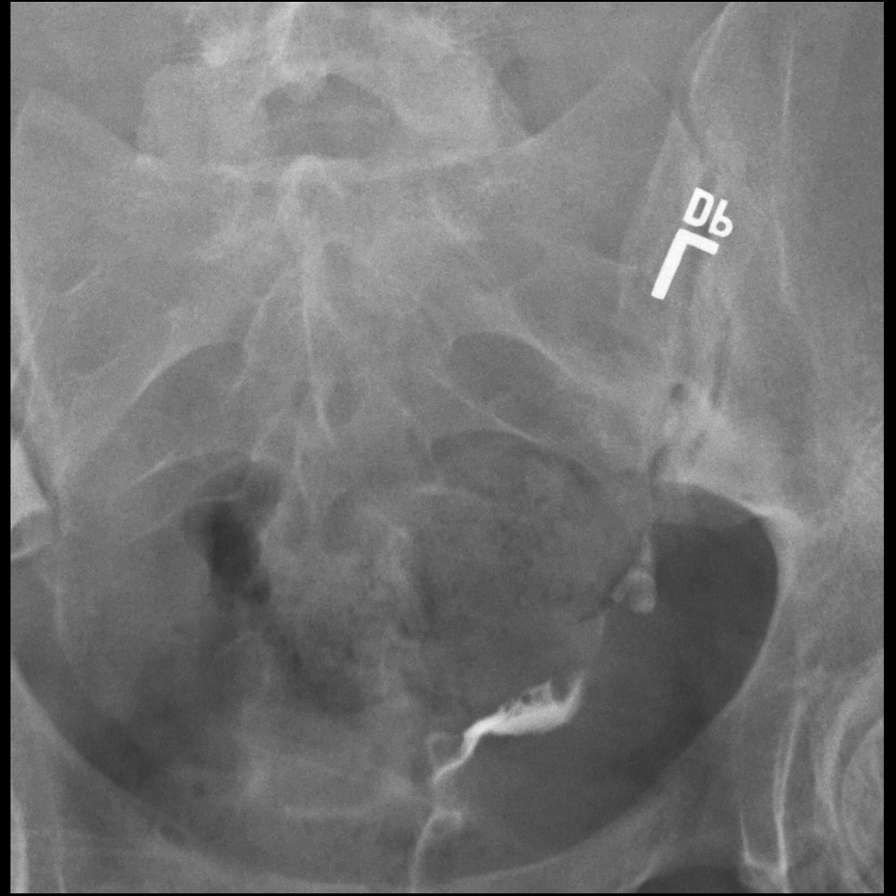

[6 of 6 positions shown; findings below may reference images not displayed]

FLUOROSCOPY TIME:  Radiation Exposure Index (as provided by the
fluoroscopic device): 311 mGy

Fluoroscopy Time:  1 minutes 36 seconds

Number of Acquired Images:  5
FINDINGS: The procedure was technically challenging. The standard balloon
catheter could not be inserted into the uterine cavity, meeting
resistance near the external os. The procedure was performed with Katrice
Ramiar catheter.

The uterine cavity is left deviated. A small persistent rounded
uterine cavity filling defect is noted near the left uterine cornu.
The left fallopian tube was opacified in its entirety and is normal
in caliber and appearance with a small amount of spillage
demonstrated at the fimbriated end of the left fallopian tube.
Nonvisualization of the right fallopian tube.
IMPRESSION: 1. Patent normal left fallopian tube.
2. Nonvisualization of the right fallopian tube. Left deviated
uterine cavity. Findings could represent an occluded right fallopian
tube, although the differential includes a unicornuate left uterus
with congenital absence of the right tube. MRI pelvis could be
obtained for further uterine anatomy characterization, as clinically
warranted.
3. Small persistent rounded uterine cavity filling defect near the
left uterine cornu, which could represent an endometrial polyp or
submucosal fibroid. Suggest correlation with pelvic ultrasound
and/or saline infusion sonohysterography.

## 2022-07-20 ENCOUNTER — Encounter: Payer: Self-pay | Admitting: Primary Care

## 2022-07-20 ENCOUNTER — Ambulatory Visit (INDEPENDENT_AMBULATORY_CARE_PROVIDER_SITE_OTHER): Payer: BC Managed Care – PPO | Admitting: Primary Care

## 2022-07-20 VITALS — BP 120/68 | HR 75 | Temp 99.0°F | Ht 66.0 in | Wt 152.0 lb

## 2022-07-20 DIAGNOSIS — Z903 Acquired absence of stomach [part of]: Secondary | ICD-10-CM | POA: Diagnosis not present

## 2022-07-20 DIAGNOSIS — G43901 Migraine, unspecified, not intractable, with status migrainosus: Secondary | ICD-10-CM

## 2022-07-20 DIAGNOSIS — Z0000 Encounter for general adult medical examination without abnormal findings: Secondary | ICD-10-CM

## 2022-07-20 DIAGNOSIS — F411 Generalized anxiety disorder: Secondary | ICD-10-CM | POA: Diagnosis not present

## 2022-07-20 NOTE — Assessment & Plan Note (Signed)
Controlled.  No concerns today. Continue to monitor.  

## 2022-07-20 NOTE — Assessment & Plan Note (Signed)
Tetanus due, declines today. Declines influenza vaccine. Pap smear UTD.  Discussed the importance of a healthy diet and regular exercise in order for weight loss, and to reduce the risk of further co-morbidity.  Exam stable. Labs reviewed through [facility]  Follow up in 1 year for repeat physical.

## 2022-07-20 NOTE — Patient Instructions (Signed)
Keep up the great work!  It was a pleasure to see you today!  Preventive Care 70-35 Years Old, Female Preventive care refers to lifestyle choices and visits with your health care provider that can promote health and wellness. Preventive care visits are also called wellness exams. What can I expect for my preventive care visit? Counseling During your preventive care visit, your health care provider may ask about your: Medical history, including: Past medical problems. Family medical history. Pregnancy history. Current health, including: Menstrual cycle. Method of birth control. Emotional well-being. Home life and relationship well-being. Sexual activity and sexual health. Lifestyle, including: Alcohol, nicotine or tobacco, and drug use. Access to firearms. Diet, exercise, and sleep habits. Work and work Statistician. Sunscreen use. Safety issues such as seatbelt and bike helmet use. Physical exam Your health care provider may check your: Height and weight. These may be used to calculate your BMI (body mass index). BMI is a measurement that tells if you are at a healthy weight. Waist circumference. This measures the distance around your waistline. This measurement also tells if you are at a healthy weight and may help predict your risk of certain diseases, such as type 2 diabetes and high blood pressure. Heart rate and blood pressure. Body temperature. Skin for abnormal spots. What immunizations do I need?  Vaccines are usually given at various ages, according to a schedule. Your health care provider will recommend vaccines for you based on your age, medical history, and lifestyle or other factors, such as travel or where you work. What tests do I need? Screening Your health care provider may recommend screening tests for certain conditions. This may include: Pelvic exam and Pap test. Lipid and cholesterol levels. Diabetes screening. This is done by checking your blood sugar  (glucose) after you have not eaten for a while (fasting). Hepatitis B test. Hepatitis C test. HIV (human immunodeficiency virus) test. STI (sexually transmitted infection) testing, if you are at risk. BRCA-related cancer screening. This may be done if you have a family history of breast, ovarian, tubal, or peritoneal cancers. Talk with your health care provider about your test results, treatment options, and if necessary, the need for more tests. Follow these instructions at home: Eating and drinking  Eat a healthy diet that includes fresh fruits and vegetables, whole grains, lean protein, and low-fat dairy products. Take vitamin and mineral supplements as recommended by your health care provider. Do not drink alcohol if: Your health care provider tells you not to drink. You are pregnant, may be pregnant, or are planning to become pregnant. If you drink alcohol: Limit how much you have to 0-1 drink a day. Know how much alcohol is in your drink. In the [place] one drink equals one 12 oz bottle of beer (355 mL), one 5 oz glass of wine (148 mL), or one 1 oz glass of hard liquor (44 mL). Lifestyle Brush your teeth every morning and night with fluoride toothpaste. Floss one time each day. Exercise for at least 30 minutes 5 or more days each week. Do not use any products that contain nicotine or tobacco. These products include cigarettes, chewing tobacco, and vaping devices, such as e-cigarettes. If you need help quitting, ask your health care provider. Do not use drugs. If you are sexually active, practice safe sex. Use a condom or other form of protection to prevent STIs. If you do not wish to become pregnant, use a form of birth control. If you plan to become pregnant, see your health  care provider for a prepregnancy visit. Find healthy ways to manage stress, such as: Meditation, yoga, or listening to music. Journaling. Talking to a trusted person. Spending time with friends and  family. Minimize exposure to UV radiation to reduce your risk of skin cancer. Safety Always wear your seat belt while driving or riding in a vehicle. Do not drive: If you have been drinking alcohol. Do not ride with someone who has been drinking. If you have been using any mind-altering substances or drugs. While texting. When you are tired or distracted. Wear a helmet and other protective equipment during sports activities. If you have firearms in your house, make sure you follow all gun safety procedures. Seek help if you have been physically or sexually abused. What's next? Go to your health care provider once a year for an annual wellness visit. Ask your health care provider how often you should have your eyes and teeth checked. Stay up to date on all vaccines. This information is not intended to replace advice given to you by your health care provider. Make sure you discuss any questions you have with your health care provider. Document Revised: 04/12/2021 Document Reviewed: 04/12/2021 Elsevier Patient Education  [place].

## 2022-07-20 NOTE — Progress Notes (Signed)
Subjective:    Patient ID: Laura Gross, female    DOB: Oct 26, 1986, 36 y.o.   MRN: [id number]  HPI  Laura Gross is a very pleasant 36 y.o. female who presents today for complete physical and follow up of chronic conditions.  Immunizations: -Tetanus: 2001, declines today -Influenza: Declines today -Covid-19: Has not completed   Diet: Fair diet.  Exercise: Regular exercise.  Eye exam: Completes annually  Dental exam: Completes semi-annually   Pap Smear: Completed in 2021   BP Readings from Last 3 Encounters:  07/20/22 120/68  12/26/21 130/62  06/27/21 (!) 142/92       Review of Systems  Constitutional:  Negative for unexpected weight change.  HENT:  Negative for rhinorrhea.   Respiratory:  Negative for cough and shortness of breath.   Cardiovascular:  Negative for chest pain.  Gastrointestinal:  Negative for constipation and diarrhea.  Genitourinary:  Negative for difficulty urinating.  Musculoskeletal:  Negative for arthralgias and myalgias.  Skin:  Negative for rash.  Allergic/Immunologic: Negative for environmental allergies.  Neurological:  Negative for dizziness, numbness and headaches.  Psychiatric/Behavioral:  The patient is not nervous/anxious.          Past Medical History:  Diagnosis Date   Chicken pox    Family history of adverse reaction to anesthesia    ponv mother   Headache    History of UTI    Left supracondylar humerus fracture 03/04/2020   Migraine    Morbid obesity ([place]) 06/26/2021   Obesity 08/14/2016    Social History   Socioeconomic History   Marital status: Married    Spouse name: Not on file   Number of children: Not on file   Years of education: Not on file   Highest education level: Not on file  Occupational History   Not on file  Tobacco Use   Smoking status: Never   Smokeless tobacco: Never  Vaping Use   Vaping Use: Never used  Substance and Sexual Activity   Alcohol use: Yes    Comment:  occasional   Drug use: No   Sexual activity: Yes  Other Topics Concern   Not on file  Social History Narrative   Not on file   Social Determinants of Health   Financial Resource Strain: Not on file  Food Insecurity: Not on file  Transportation Needs: Not on file  Physical Activity: Not on file  Stress: Not on file  Social Connections: Not on file  Intimate Partner Violence: Not on file    Past Surgical History:  Procedure Laterality Date   APPENDECTOMY  yrs ago   [place] N/A 06/26/2021   Procedure: LAPAROSCOPIC GASTRIC SLEEVE RESECTION;  Surgeon: Clovis Riley, MD;  Location: [facility];  Service: General;  Laterality: N/A;   ORIF HUMERUS FRACTURE Left 03/08/2020   Procedure: OPEN REDUCTION INTERNAL FIXATION (ORIF) HUMERAL SHAFT FRACTURE;  Surgeon: Renette Butters, MD;  Location: [facility];  Service: Orthopedics;  Laterality: Left;   SPONTANEOUS HIP DISLOCATION REPAIR Left    TONSILLECTOMY  2008 or 2009   UPPER GI ENDOSCOPY N/A 06/26/2021   Procedure: UPPER GI ENDOSCOPY;  Surgeon: Clovis Riley, MD;  Location: [facility];  Service: General;  Laterality: N/A;    Family History  Problem Relation Age of Onset   Diabetes Paternal Grandmother    Cancer Paternal Grandmother        lung   Hyperlipidemia Mother  Arthritis Mother    Cancer Maternal Grandmother        breast   Cancer Maternal Grandfather        nelanoma    Allergies  Allergen Reactions   Penicillins Hives    No current outpatient medications on file prior to visit.   No current facility-administered medications on file prior to visit.    BP 120/68   Pulse 75   Temp 99 F (37.2 C) (Temporal)   Ht 5\' 6"  (1.676 m)   Wt 152 lb (68.9 kg)   LMP 07/09/2022 (Exact Date)   SpO2 100%   BMI 24.53 kg/m  Objective:   Physical Exam HENT:     Right Ear: Tympanic membrane and ear canal normal.     Left Ear: Tympanic membrane and ear  canal normal.     Nose: Nose normal.  Eyes:     Conjunctiva/sclera: Conjunctivae normal.     Pupils: Pupils are equal, round, and reactive to light.  Neck:     Thyroid: No thyromegaly.  Cardiovascular:     Rate and Rhythm: Normal rate and regular rhythm.     Heart sounds: No murmur heard. Pulmonary:     Effort: Pulmonary effort is normal.     Breath sounds: Normal breath sounds. No rales.  Abdominal:     General: Bowel sounds are normal.     Palpations: Abdomen is soft.     Tenderness: There is no abdominal tenderness.  Musculoskeletal:        General: Normal range of motion.     Cervical back: Neck supple.  Lymphadenopathy:     Cervical: No cervical adenopathy.  Skin:    General: Skin is warm and dry.     Findings: No rash.  Neurological:     Mental Status: She is alert and oriented to person, place, and time.     Cranial Nerves: No cranial nerve deficit.     Deep Tendon Reflexes: Reflexes are normal and symmetric.  Psychiatric:        Mood and Affect: Mood normal.           Assessment & Plan:   Problem List Items Addressed This Visit       Cardiovascular and Mediastinum   Migraine with status migrainosus, not intractable    Controlled.  No concerns today. Continue to monitor.         Other   Preventative health care    Tetanus due, declines today. Declines influenza vaccine. Pap smear UTD.  Discussed the importance of a healthy diet and regular exercise in order for weight loss, and to reduce the risk of further co-morbidity.  Exam stable. Labs reviewed through [facility]  Follow up in 1 year for repeat physical.       GAD (generalized anxiety disorder)    Controlled. No concerns today.  Continue to monitor       History of sleeve gastrectomy    Weight loss of 87 pounds since surgery, commended her on this!!  Continue regular exercise. Continue to work on diet.  Reviewed office notes and labs from bariatric surgeon through [facility] from August 2023.          Pleas Koch, NP

## 2022-07-20 NOTE — Assessment & Plan Note (Signed)
Weight loss of 87 pounds since surgery, commended her on this!!  Continue regular exercise. Continue to work on diet.  Reviewed office notes and labs from bariatric surgeon through [facility] from August 2023.

## 2022-07-31 ENCOUNTER — Telehealth: Payer: BC Managed Care – PPO | Admitting: Physician Assistant

## 2022-07-31 DIAGNOSIS — R3989 Other symptoms and signs involving the genitourinary system: Secondary | ICD-10-CM

## 2022-07-31 MED ORDER — NITROFURANTOIN MONOHYD MACRO 100 MG PO CAPS: 100.0000 mg | ORAL_CAPSULE | Freq: Two times a day (BID) | ORAL | 0 refills | Status: DC

## 2022-07-31 NOTE — Progress Notes (Signed)

## 2022-07-31 NOTE — Progress Notes (Signed)
I have spent 5 minutes in review of e-visit questionnaire, review and updating patient chart, medical decision making and response to patient.   Siniya Lichty Cody Katlyn Muldrew, PA-C    

## 2022-12-31 ENCOUNTER — Telehealth: Payer: BC Managed Care – PPO | Admitting: Physician Assistant

## 2022-12-31 DIAGNOSIS — B9689 Other specified bacterial agents as the cause of diseases classified elsewhere: Secondary | ICD-10-CM

## 2022-12-31 DIAGNOSIS — J019 Acute sinusitis, unspecified: Secondary | ICD-10-CM

## 2022-12-31 MED ORDER — DOXYCYCLINE HYCLATE 100 MG PO TABS
100.0000 mg | ORAL_TABLET | Freq: Two times a day (BID) | ORAL | 0 refills | Status: AC
Start: 2022-12-31 — End: ?

## 2022-12-31 NOTE — Progress Notes (Signed)
E-Visit for Sinus Problems  We are sorry that you are not feeling well.  Here is how we plan to help!  Based on what you have shared with me it looks like you have sinusitis.  Sinusitis is inflammation and infection in the sinus cavities of the head.  Based on your presentation I believe you most likely have Acute Bacterial Sinusitis.  This is an infection caused by bacteria and is treated with antibiotics. I have prescribed Doxycycline 100mg by mouth twice a day for 10 days. You may use an oral decongestant such as Mucinex D or if you have glaucoma or high blood pressure use plain Mucinex. Saline nasal spray help and can safely be used as often as needed for congestion.  If you develop worsening sinus pain, fever or notice severe headache and vision changes, or if symptoms are not better after completion of antibiotic, please schedule an appointment with a health care provider.    Sinus infections are not as easily transmitted as other respiratory infection, however we still recommend that you avoid close contact with loved ones, especially the very young and elderly.  Remember to wash your hands thoroughly throughout the day as this is the number one way to prevent the spread of infection!  Home Care: Only take medications as instructed by your medical team. Complete the entire course of an antibiotic. Do not take these medications with alcohol. A steam or ultrasonic humidifier can help congestion.  You can place a towel over your head and breathe in the steam from hot water coming from a faucet. Avoid close contacts especially the very young and the elderly. Cover your mouth when you cough or sneeze. Always remember to wash your hands.  Get Help Right Away If: You develop worsening fever or sinus pain. You develop a severe head ache or visual changes. Your symptoms persist after you have completed your treatment plan.  Make sure you Understand these instructions. Will watch your  condition. Will get help right away if you are not doing well or get worse.  Thank you for choosing an e-visit.  Your e-visit answers were reviewed by a board certified advanced clinical practitioner to complete your personal care plan. Depending upon the condition, your plan could have included both over the counter or prescription medications.  Please review your pharmacy choice. Make sure the pharmacy is open so you can pick up prescription now. If there is a problem, you may contact your provider through MyChart messaging and have the prescription routed to another pharmacy.  Your safety is important to us. If you have drug allergies check your prescription carefully.   For the next 24 hours you can use MyChart to ask questions about today's visit, request a non-urgent call back, or ask for a work or school excuse. You will get an email in the next two days asking about your experience. I hope that your e-visit has been valuable and will speed your recovery.  I have spent 5 minutes in review of e-visit questionnaire, review and updating patient chart, medical decision making and response to patient.   Yameli Delamater M Apryle Stowell, PA-C  

## 2023-01-16 DIAGNOSIS — Z1283 Encounter for screening for malignant neoplasm of skin: Secondary | ICD-10-CM | POA: Diagnosis not present

## 2023-01-16 DIAGNOSIS — D225 Melanocytic nevi of trunk: Secondary | ICD-10-CM | POA: Diagnosis not present

## 2023-01-16 DIAGNOSIS — D485 Neoplasm of uncertain behavior of skin: Secondary | ICD-10-CM | POA: Diagnosis not present

## 2023-01-16 DIAGNOSIS — D2262 Melanocytic nevi of left upper limb, including shoulder: Secondary | ICD-10-CM | POA: Diagnosis not present

## 2023-02-19 DIAGNOSIS — D2262 Melanocytic nevi of left upper limb, including shoulder: Secondary | ICD-10-CM | POA: Diagnosis not present

## 2023-02-19 DIAGNOSIS — D485 Neoplasm of uncertain behavior of skin: Secondary | ICD-10-CM | POA: Diagnosis not present

## 2023-02-19 DIAGNOSIS — D2271 Melanocytic nevi of right lower limb, including hip: Secondary | ICD-10-CM | POA: Diagnosis not present

## 2023-05-01 DIAGNOSIS — D485 Neoplasm of uncertain behavior of skin: Secondary | ICD-10-CM | POA: Diagnosis not present

## 2023-05-01 DIAGNOSIS — D2271 Melanocytic nevi of right lower limb, including hip: Secondary | ICD-10-CM | POA: Diagnosis not present

## 2023-07-26 ENCOUNTER — Ambulatory Visit: Payer: BC Managed Care – PPO | Admitting: Primary Care

## 2023-08-01 ENCOUNTER — Encounter: Payer: Self-pay | Admitting: Primary Care

## 2023-08-01 ENCOUNTER — Ambulatory Visit: Payer: BC Managed Care – PPO | Admitting: Primary Care

## 2023-08-01 VITALS — BP 130/86 | HR 63 | Temp 98.1°F | Ht 66.0 in | Wt 166.0 lb

## 2023-08-01 DIAGNOSIS — Z0000 Encounter for general adult medical examination without abnormal findings: Secondary | ICD-10-CM | POA: Diagnosis not present

## 2023-08-01 DIAGNOSIS — Z0001 Encounter for general adult medical examination with abnormal findings: Secondary | ICD-10-CM

## 2023-08-01 DIAGNOSIS — F411 Generalized anxiety disorder: Secondary | ICD-10-CM

## 2023-08-01 DIAGNOSIS — Z903 Acquired absence of stomach [part of]: Secondary | ICD-10-CM

## 2023-08-01 DIAGNOSIS — R42 Dizziness and giddiness: Secondary | ICD-10-CM | POA: Insufficient documentation

## 2023-08-01 DIAGNOSIS — G43001 Migraine without aura, not intractable, with status migrainosus: Secondary | ICD-10-CM | POA: Diagnosis not present

## 2023-08-01 HISTORY — DX: Dizziness and giddiness: R42

## 2023-08-01 LAB — LIPID PANEL
Cholesterol: 161 mg/dL (ref 0–200)
HDL: 62.7 mg/dL (ref >39.00)
LDL Cholesterol: 53 mg/dL (ref 0–99)
NonHDL: 98.4
Total CHOL/HDL Ratio: 3
Triglycerides: 229 mg/dL — ABNORMAL HIGH (ref 0.0–149.0)
VLDL: 45.8 mg/dL — ABNORMAL HIGH (ref 0.0–40.0)

## 2023-08-01 LAB — COMPREHENSIVE METABOLIC PANEL
ALT: 13 U/L (ref 0–35)
AST: 30 U/L (ref 0–37)
Albumin: 4.3 g/dL (ref 3.5–5.2)
Alkaline Phosphatase: 59 U/L (ref 39–117)
BUN: 12 mg/dL (ref 6–23)
CO2: 28 meq/L (ref 19–32)
Calcium: 9.3 mg/dL (ref 8.4–10.5)
Chloride: 105 meq/L (ref 96–112)
Creatinine, Ser: 0.65 mg/dL (ref 0.40–1.20)
GFR: 113.63 mL/min (ref >60.00)
Glucose, Bld: 87 mg/dL (ref 70–99)
Potassium: 4.4 meq/L (ref 3.5–5.1)
Sodium: 138 meq/L (ref 135–145)
Total Bilirubin: 0.6 mg/dL (ref 0.2–1.2)
Total Protein: 6.7 g/dL (ref 6.0–8.3)

## 2023-08-01 LAB — CBC
HCT: 39.8 % (ref 36.0–46.0)
Hemoglobin: 13.3 g/dL (ref 12.0–15.0)
MCHC: 33.5 g/dL (ref 30.0–36.0)
MCV: 88.6 fL (ref 78.0–100.0)
Platelets: 259 10*3/uL (ref 150.0–400.0)
RBC: 4.49 Mil/uL (ref 3.87–5.11)
RDW: 12.5 % (ref 11.5–15.5)
WBC: 6.1 10*3/uL (ref 4.0–10.5)

## 2023-08-01 LAB — IBC + FERRITIN
Ferritin: 14.3 ng/mL (ref 10.0–291.0)
Iron: 81 ug/dL (ref 42–145)
Saturation Ratios: 19.5 % — ABNORMAL LOW (ref 20.0–50.0)
TIBC: 414.4 ug/dL (ref 250.0–450.0)
Transferrin: 296 mg/dL (ref 212.0–360.0)

## 2023-08-01 LAB — VITAMIN B12: Vitamin B-12: 346 pg/mL (ref 211–911)

## 2023-08-01 LAB — HEMOGLOBIN A1C: Hgb A1c MFr Bld: 4.8 % (ref 4.6–6.5)

## 2023-08-01 LAB — TSH: TSH: 1.55 u[IU]/mL (ref 0.35–5.50)

## 2023-08-01 LAB — FOLATE: Folate: 6 ng/mL (ref >5.9)

## 2023-08-01 LAB — VITAMIN D 25 HYDROXY (VIT D DEFICIENCY, FRACTURES): VITD: 18.13 ng/mL — ABNORMAL LOW (ref 30.00–100.00)

## 2023-08-01 NOTE — Assessment & Plan Note (Signed)
Infrequent migraines.   Continue to monitor.

## 2023-08-01 NOTE — Assessment & Plan Note (Addendum)
Symptoms suggestive of hypoglycemia, however she is eating frequently throughout the day, and has no reason to be experiencing hypoglycemic episodes. We discussed to snack on more protein rather than carbs during episodes.  Checking labs today including vitamin D, B12, CBC, TSH, A1c, CMP.  Continue hydration with water.

## 2023-08-01 NOTE — Assessment & Plan Note (Signed)
Weight gain of 14 pounds since last year. Continue to work on physical activity and a healthy diet.  Labs pending.

## 2023-08-01 NOTE — Assessment & Plan Note (Signed)
Declines tetanus and influenza vaccines. Pap smear due in November, discussed with patient.  Discussed the importance of a healthy diet and regular exercise in order for weight loss, and to reduce the risk of further co-morbidity.  Exam stable. Labs pending.  Follow up in 1 year for repeat physical.

## 2023-08-01 NOTE — Assessment & Plan Note (Signed)
Stable. Continue to monitor.  

## 2023-08-01 NOTE — Progress Notes (Signed)
Subjective:    Patient ID: Laura Gross, female    DOB: [birth year], 37 y.o.   MRN: [id number]  HPI  Laura Gross is a very pleasant 37 y.o. female who presents today for complete physical and follow up of chronic conditions.  Over the last 6 months she's noticed intermittent sensation of breaking out in sweats, feels jittery, feel hot and cold, feels like she could pass out if she doesn't eat. She will eat a snack and feel much better. She eats breakfast, snack, lunch, snack, dinner. Over the last several months she's noticed symptoms occurring 2-3 days weekly. She denies syncope.  Immunizations: -Tetanus: Completed in 2001, declines -Influenza: Declines influenza vaccine.  Diet: Fair diet.  Exercise: Walking, active  Eye exam: Completes annually  Dental exam: Completes semi-annually    Pap Smear: November 2021  BP Readings from Last 3 Encounters:  08/01/23 130/86  07/20/22 120/68  12/26/21 130/62   Wt Readings from Last 3 Encounters:  08/01/23 166 lb (75.3 kg)  07/20/22 152 lb (68.9 kg)  12/26/21 167 lb (75.8 kg)         Review of Systems  Constitutional:  Positive for chills. Negative for unexpected weight change.  HENT:  Negative for rhinorrhea.   Respiratory:  Negative for cough and shortness of breath.   Cardiovascular:  Negative for chest pain.  Gastrointestinal:  Negative for constipation and diarrhea.  Genitourinary:  Negative for difficulty urinating.  Musculoskeletal:  Negative for arthralgias and myalgias.  Skin:  Negative for rash.  Allergic/Immunologic: Negative for environmental allergies.  Neurological:  Positive for dizziness and light-headedness. Negative for headaches.  Psychiatric/Behavioral:  The patient is nervous/anxious.          Past Medical History:  Diagnosis Date   Chicken pox    Encounter for infertility 07/19/2017   Family history of adverse reaction to anesthesia    ponv mother   Headache    History of  UTI    Left supracondylar humerus fracture 03/04/2020   Migraine    Morbid obesity ([facility]) 06/26/2021   Obesity 08/14/2016    Social History   Socioeconomic History   Marital status: Married    Spouse name: Not on file   Number of children: Not on file   Years of education: Not on file   Highest education level: Not on file  Occupational History   Not on file  Tobacco Use   Smoking status: Never   Smokeless tobacco: Never  Vaping Use   Vaping status: Never Used  Substance and Sexual Activity   Alcohol use: Yes    Comment: occasional   Drug use: No   Sexual activity: Yes  Other Topics Concern   Not on file  Social History Narrative   Not on file   Social Determinants of Health   Financial Resource Strain: Not on file  Food Insecurity: Not on file  Transportation Needs: Not on file  Physical Activity: Not on file  Stress: Not on file  Social Connections: Not on file  Intimate Partner Violence: Not on file    Past Surgical History:  Procedure Laterality Date   APPENDECTOMY  yrs ago   FEMUR SURGERY Left    LAPAROSCOPIC GASTRIC SLEEVE RESECTION N/A 06/26/2021   Procedure: LAPAROSCOPIC GASTRIC SLEEVE RESECTION;  Surgeon: Berna Bue, MD;  Location: [facility];  Service: General;  Laterality: N/A;   ORIF HUMERUS FRACTURE Left 03/08/2020   Procedure: OPEN REDUCTION INTERNAL FIXATION (ORIF) HUMERAL SHAFT FRACTURE;  Surgeon: Eulah Pont,  Jewel Baize, MD;  Location: [facility];  Service: Orthopedics;  Laterality: Left;   SPONTANEOUS HIP DISLOCATION REPAIR Left    TONSILLECTOMY  2008 or 2009   UPPER GI ENDOSCOPY N/A 06/26/2021   Procedure: UPPER GI ENDOSCOPY;  Surgeon: Berna Bue, MD;  Location: [facility];  Service: General;  Laterality: N/A;    Family History  Problem Relation Age of Onset   Diabetes Paternal Grandmother    Cancer Paternal Grandmother        lung   Hyperlipidemia Mother    Arthritis Mother    Cancer Maternal Grandmother        breast    Cancer Maternal Grandfather        nelanoma    Allergies  Allergen Reactions   Penicillins Hives    No current outpatient medications on file prior to visit.   No current facility-administered medications on file prior to visit.    BP 130/86   Pulse 63   Temp 98.1 F (36.7 C) (Temporal)   Ht 5\' 6"  (1.676 m)   Wt 166 lb (75.3 kg)   LMP 07/29/2023   SpO2 96%   BMI 26.79 kg/m  Objective:   Physical Exam HENT:     Right Ear: Tympanic membrane and ear canal normal.     Left Ear: Tympanic membrane and ear canal normal.  Eyes:     Pupils: Pupils are equal, round, and reactive to light.  Cardiovascular:     Rate and Rhythm: Normal rate and regular rhythm.  Pulmonary:     Effort: Pulmonary effort is normal.     Breath sounds: Normal breath sounds.  Abdominal:     General: Bowel sounds are normal.     Palpations: Abdomen is soft.     Tenderness: There is no abdominal tenderness.  Musculoskeletal:        General: Normal range of motion.     Cervical back: Neck supple.  Skin:    General: Skin is warm and dry.  Neurological:     Mental Status: She is alert and oriented to person, place, and time.     Cranial Nerves: No cranial nerve deficit.     Deep Tendon Reflexes:     Reflex Scores:      Patellar reflexes are 2+ on the right side and 2+ on the left side. Psychiatric:        Mood and Affect: Mood normal.           Assessment & Plan:  Encounter for annual general medical examination with abnormal findings in adult Assessment & Plan: Declines tetanus and influenza vaccines. Pap smear due in November, discussed with patient.  Discussed the importance of a healthy diet and regular exercise in order for weight loss, and to reduce the risk of further co-morbidity.  Exam stable. Labs pending.  Follow up in 1 year for repeat physical.   Orders: -     Lipid panel -     Hemoglobin A1c  Migraine without aura and with status migrainosus, not  intractable Assessment & Plan: Infrequent migraines.   Continue to monitor.    GAD (generalized anxiety disorder) Assessment & Plan: Stable.   Continue to monitor.   History of sleeve gastrectomy Assessment & Plan: Weight gain of 14 pounds since last year. Continue to work on physical activity and a healthy diet.  Labs pending.  Orders: -     Vitamin B12 -     VITAMIN D 25 Hydroxy (  Vit-D Deficiency, Fractures) -     Folate -     Vitamin B1 -     IBC + Ferritin  Dizziness Assessment & Plan: Symptoms suggestive of hypoglycemia, however she is eating frequently throughout the day, and has no reason to be experiencing hypoglycemic episodes. We discussed to snack on more protein rather than carbs during episodes.  Checking labs today including vitamin D, B12, CBC, TSH, A1c, CMP.  Continue hydration with water.  Orders: -     Comprehensive metabolic panel -     CBC -     TSH        Doreene Nest, NP

## 2023-08-01 NOTE — Patient Instructions (Signed)
Stop by the lab prior to leaving today. I will notify you of your results once received.   Increase intake of protein to prevent episodes.  It was a pleasure to see you today!

## 2023-08-05 LAB — VITAMIN B1: Vitamin B1 (Thiamine): 10 nmol/L (ref 8–30)

## 2023-08-06 DIAGNOSIS — E559 Vitamin D deficiency, unspecified: Secondary | ICD-10-CM

## 2023-08-06 MED ORDER — VITAMIN D (ERGOCALCIFEROL) 1.25 MG (50000 UNIT) PO CAPS: ORAL_CAPSULE | ORAL | 0 refills | Status: DC

## 2023-08-28 DIAGNOSIS — N978 Female infertility of other origin: Secondary | ICD-10-CM | POA: Diagnosis not present

## 2023-08-28 DIAGNOSIS — Z3161 Procreative counseling and advice using natural family planning: Secondary | ICD-10-CM | POA: Diagnosis not present

## 2023-09-03 DIAGNOSIS — Z3141 Encounter for fertility testing: Secondary | ICD-10-CM | POA: Diagnosis not present

## 2023-10-08 ENCOUNTER — Ambulatory Visit: Payer: BC Managed Care – PPO | Admitting: Family Medicine

## 2023-10-08 VITALS — BP 118/68 | HR 68 | Temp 98.4°F | Ht 66.0 in | Wt 169.5 lb

## 2023-10-08 DIAGNOSIS — R1011 Right upper quadrant pain: Secondary | ICD-10-CM | POA: Insufficient documentation

## 2023-10-08 NOTE — Patient Instructions (Signed)
I put the referral in - for ultrasound of your abdomen   Please let us know if you don't hear in 1-2 weeks to set that up , let that up   If this pain re occurs and does not get better or becomes severe - go to the ER

## 2023-10-08 NOTE — Progress Notes (Signed)
Subjective:    Patient ID: Laura Gross, female    DOB: 24-Dec-1985, 37 y.o.   MRN: [id number]  HPI  Wt Readings from Last 3 Encounters:  10/08/23 169 lb 8 oz (76.9 kg)  08/01/23 166 lb (75.3 kg)  07/20/22 152 lb (68.9 kg)   27.36 kg/m  Vitals:   10/08/23 1554  BP: 118/68  Pulse: 68  Temp: 98.4 F (36.9 C)  SpO2: 100%     Pt presents with concern of a possible hernia  Right side under rib cage   Sometimes when she gets over she feels a pop under rib cage and it hurts  She has to press down and slide it Does not bother her at all otherwise  Ribs are not tender to the touch   Once it lasted 15 minutes  Almost went to ER Had to keep pushing on it    No history of liver problems   Occational alcohol   Lab Results  Component Value Date   ALT 13 08/01/2023   AST 30 08/01/2023   ALKPHOS 59 08/01/2023   BILITOT 0.6 08/01/2023   Lab Results  Component Value Date   NA 138 08/01/2023   K 4.4 08/01/2023   CO2 28 08/01/2023   GLUCOSE 87 08/01/2023   BUN 12 08/01/2023   CREATININE 0.65 08/01/2023   CALCIUM 9.3 08/01/2023   GFR 113.63 08/01/2023   GFRNONAA >60 06/27/2021     No history of gallstones    Has history of lap gastric sleeve resection  Also appy    Patient Active Problem List   Diagnosis Date Noted   RUQ pain 10/08/2023   Dizziness 08/01/2023   History of sleeve gastrectomy 12/22/2021   GAD (generalized anxiety disorder) 12/18/2017   Migraine with status migrainosus, not intractable 08/14/2016   Encounter for annual general medical examination with abnormal findings in adult 08/14/2016   Past Medical History:  Diagnosis Date   Chicken pox    Encounter for infertility 07/19/2017   Family history of adverse reaction to anesthesia    ponv mother   Headache    History of UTI    Left supracondylar humerus fracture 03/04/2020   Migraine    Morbid obesity (HCC) 06/26/2021   Obesity 08/14/2016   Past Surgical History:   Procedure Laterality Date   APPENDECTOMY  yrs ago   FEMUR SURGERY Left    LAPAROSCOPIC GASTRIC SLEEVE RESECTION N/A 06/26/2021   Procedure: LAPAROSCOPIC GASTRIC SLEEVE RESECTION;  Surgeon: Berna Bue, MD;  Location: [facility];  Service: General;  Laterality: N/A;   ORIF HUMERUS FRACTURE Left 03/08/2020   Procedure: OPEN REDUCTION INTERNAL FIXATION (ORIF) HUMERAL SHAFT FRACTURE;  Surgeon: Sheral Apley, MD;  Location: [facility];  Service: Orthopedics;  Laterality: Left;   SPONTANEOUS HIP DISLOCATION REPAIR Left    TONSILLECTOMY  2008 or 2009   UPPER GI ENDOSCOPY N/A 06/26/2021   Procedure: UPPER GI ENDOSCOPY;  Surgeon: Berna Bue, MD;  Location: [facility];  Service: General;  Laterality: N/A;   Social History   Tobacco Use   Smoking status: Never   Smokeless tobacco: Never  Vaping Use   Vaping status: Never Used  Substance Use Topics   Alcohol use: Yes    Comment: occasional   Drug use: No   Family History  Problem Relation Age of Onset   Diabetes Paternal Grandmother    Cancer Paternal Grandmother        lung   Hyperlipidemia Mother  Arthritis Mother    Cancer Maternal Grandmother        breast   Cancer Maternal Grandfather        nelanoma   Allergies  Allergen Reactions   Penicillins Hives   Current Outpatient Medications on File Prior to Visit  Medication Sig Dispense Refill   Vitamin D, Ergocalciferol, (DRISDOL) 1.25 MG (50000 UNIT) CAPS capsule Take 1 capsule by mouth once weekly for 12 weeks. 12 capsule 0   No current facility-administered medications on file prior to visit.    Review of Systems  Constitutional:  Negative for activity change, appetite change, fatigue, fever and unexpected weight change.  HENT:  Negative for congestion, ear pain, rhinorrhea, sinus pressure and sore throat.   Eyes:  Negative for pain, redness and visual disturbance.  Respiratory:  Negative for cough, shortness of breath and wheezing.    Cardiovascular:  Negative for chest pain and palpitations.  Gastrointestinal:  Positive for abdominal pain. Negative for abdominal distention, blood in stool, constipation and diarrhea.       Abdominal pain -brief /only when bending forward at times   Endocrine: Negative for polydipsia and polyuria.  Genitourinary:  Negative for dysuria, frequency and urgency.  Musculoskeletal:  Negative for arthralgias, back pain and myalgias.  Skin:  Negative for pallor and rash.  Allergic/Immunologic: Negative for environmental allergies.  Neurological:  Negative for dizziness, syncope and headaches.  Hematological:  Negative for adenopathy. Does not bruise/bleed easily.  Psychiatric/Behavioral:  Negative for decreased concentration and dysphoric mood. The patient is not nervous/anxious.        Objective:   Physical Exam Constitutional:      General: She is not in acute distress.    Appearance: Normal appearance. She is well-developed and normal weight. She is not ill-appearing or diaphoretic.  HENT:     Head: Normocephalic and atraumatic.  Eyes:     Conjunctiva/sclera: Conjunctivae normal.     Pupils: Pupils are equal, round, and reactive to light.  Neck:     Thyroid: No thyromegaly.     Vascular: No carotid bruit or JVD.  Cardiovascular:     Rate and Rhythm: Normal rate and regular rhythm.     Heart sounds: Normal heart sounds.     No gallop.  Pulmonary:     Effort: Pulmonary effort is normal. No respiratory distress.     Breath sounds: Normal breath sounds. No wheezing or rales.  Abdominal:     General: Abdomen is flat. Bowel sounds are normal. There is no distension or abdominal bruit. There are no signs of injury.     Palpations: Abdomen is soft. There is no shifting dullness, fluid wave, hepatomegaly, splenomegaly, mass or pulsatile mass.     Tenderness: There is no abdominal tenderness. There is no right CVA tenderness, left CVA tenderness, guarding or rebound. Negative signs include  Murphy's sign, Rovsing's sign and McBurney's sign.     Hernia: No hernia is present.     Comments: No abdominal tenderness  Lap scars around umbilicus noted  No bulge noted , standing ,sitting or during valsalva   Pt was unable to reproduce symptoms after bending forward and changing position today  Musculoskeletal:     Cervical back: Normal range of motion and neck supple.     Right lower leg: No edema.     Left lower leg: No edema.  Lymphadenopathy:     Cervical: No cervical adenopathy.  Skin:    General: Skin is warm and dry.  Coloration: Skin is not jaundiced or pale.     Findings: No rash.  Neurological:     Mental Status: She is alert.     Coordination: Coordination normal.     Deep Tendon Reflexes: Reflexes are normal and symmetric. Reflexes normal.  Psychiatric:        Mood and Affect: Mood normal.           Assessment & Plan:   Problem List Items Addressed This Visit       Other   RUQ pain - Primary    Pt has discomfort on RUQ of abdomen that occurs after bending over and is relieved by applying pressure to the rea involved  Pt notes she feels like she is able to push something back into place but does not feel a bulge or discrete lump  History of appy and gastric sleeve procedure in the past- does not have any incisions up this high  Discussed possibility of hernia or adhesions  Normal exam today  [place] of abdomen ordered  Call back and Er precautions noted in detail today  -see AVS           Relevant Orders   US Abdomen Complete

## 2023-10-08 NOTE — Assessment & Plan Note (Addendum)
Pt has discomfort on RUQ of abdomen that occurs after bending over and is relieved by applying pressure to the rea involved  Pt notes she feels like she is able to push something back into place but does not feel a bulge or discrete lump  History of appy and gastric sleeve procedure in the past- does not have any incisions up this high  Reviewed last surgical notes from [facility] Discussed possibility of hernia or adhesions  Normal exam today  [place] of abdomen ordered  Call back and Er precautions noted in detail today  -see AVS

## 2023-10-09 ENCOUNTER — Ambulatory Visit
Admission: RE | Admit: 2023-10-09 | Discharge: 2023-10-09 | Disposition: A | Discharge status: Home / Self Care | Payer: BC Managed Care – PPO | Source: Ambulatory Visit | Attending: Family Medicine | Admitting: Family Medicine

## 2023-10-09 DIAGNOSIS — R1011 Right upper quadrant pain: Secondary | ICD-10-CM

## 2023-10-23 ENCOUNTER — Other Ambulatory Visit: Payer: Self-pay | Admitting: Medical Genetics

## 2023-10-25 ENCOUNTER — Other Ambulatory Visit: Payer: BC Managed Care – PPO

## 2023-12-11 ENCOUNTER — Telehealth: Payer: BC Managed Care – PPO | Admitting: Physician Assistant

## 2023-12-11 DIAGNOSIS — J069 Acute upper respiratory infection, unspecified: Secondary | ICD-10-CM | POA: Diagnosis not present

## 2023-12-11 MED ORDER — BENZONATATE 100 MG PO CAPS
100.0000 mg | ORAL_CAPSULE | Freq: Three times a day (TID) | ORAL | 0 refills | Status: DC | PRN
Start: 2023-12-11 — End: 2024-08-07

## 2023-12-11 MED ORDER — FLUTICASONE PROPIONATE 50 MCG/ACT NA SUSP: 2.0000 | Freq: Every day | NASAL | 0 refills | Status: DC

## 2023-12-11 NOTE — Progress Notes (Signed)

## 2023-12-13 ENCOUNTER — Ambulatory Visit: Payer: BC Managed Care – PPO | Admitting: Primary Care

## 2024-01-31 ENCOUNTER — Encounter (HOSPITAL_COMMUNITY): Payer: Self-pay | Admitting: *Deleted

## 2024-08-07 ENCOUNTER — Ambulatory Visit
Admission: RE | Admit: 2024-08-07 | Discharge: 2024-08-07 | Disposition: A | Discharge status: Home / Self Care | Source: Ambulatory Visit | Attending: Primary Care | Admitting: Primary Care

## 2024-08-07 ENCOUNTER — Ambulatory Visit (INDEPENDENT_AMBULATORY_CARE_PROVIDER_SITE_OTHER): Admitting: Primary Care

## 2024-08-07 ENCOUNTER — Encounter: Payer: Self-pay | Admitting: Primary Care

## 2024-08-07 VITALS — BP 134/60 | HR 78 | Temp 98.6°F | Ht 67.0 in | Wt 153.0 lb

## 2024-08-07 DIAGNOSIS — G43001 Migraine without aura, not intractable, with status migrainosus: Secondary | ICD-10-CM

## 2024-08-07 DIAGNOSIS — Z903 Acquired absence of stomach [part of]: Secondary | ICD-10-CM

## 2024-08-07 DIAGNOSIS — M1612 Unilateral primary osteoarthritis, left hip: Secondary | ICD-10-CM | POA: Diagnosis not present

## 2024-08-07 DIAGNOSIS — Z0001 Encounter for general adult medical examination with abnormal findings: Secondary | ICD-10-CM | POA: Diagnosis not present

## 2024-08-07 DIAGNOSIS — M533 Sacrococcygeal disorders, not elsewhere classified: Secondary | ICD-10-CM

## 2024-08-07 DIAGNOSIS — F411 Generalized anxiety disorder: Secondary | ICD-10-CM

## 2024-08-07 LAB — VITAMIN B12: Vitamin B-12: 320 pg/mL (ref 211–911)

## 2024-08-07 LAB — COMPREHENSIVE METABOLIC PANEL WITH GFR
ALT: 11 U/L (ref 0–35)
AST: 29 U/L (ref 0–37)
Albumin: 4.6 g/dL (ref 3.5–5.2)
Alkaline Phosphatase: 50 U/L (ref 39–117)
BUN: 10 mg/dL (ref 6–23)
CO2: 28 meq/L (ref 19–32)
Calcium: 9.3 mg/dL (ref 8.4–10.5)
Chloride: 104 meq/L (ref 96–112)
Creatinine, Ser: 0.68 mg/dL (ref 0.40–1.20)
GFR: 111.6 mL/min (ref >60.00)
Glucose, Bld: 84 mg/dL (ref 70–99)
Potassium: 4.4 meq/L (ref 3.5–5.1)
Sodium: 139 meq/L (ref 135–145)
Total Bilirubin: 0.8 mg/dL (ref 0.2–1.2)
Total Protein: 6.9 g/dL (ref 6.0–8.3)

## 2024-08-07 LAB — CBC
HCT: 39.6 % (ref 36.0–46.0)
Hemoglobin: 13 g/dL (ref 12.0–15.0)
MCHC: 32.7 g/dL (ref 30.0–36.0)
MCV: 86.7 fl (ref 78.0–100.0)
Platelets: 214 K/uL (ref 150.0–400.0)
RBC: 4.57 Mil/uL (ref 3.87–5.11)
RDW: 13 % (ref 11.5–15.5)
WBC: 3.8 K/uL — ABNORMAL LOW (ref 4.0–10.5)

## 2024-08-07 LAB — LIPID PANEL
Cholesterol: 166 mg/dL (ref 0–200)
HDL: 75.5 mg/dL (ref >39.00)
LDL Cholesterol: 77 mg/dL (ref 0–99)
NonHDL: 90.43
Total CHOL/HDL Ratio: 2
Triglycerides: 69 mg/dL (ref 0.0–149.0)
VLDL: 13.8 mg/dL (ref 0.0–40.0)

## 2024-08-07 LAB — VITAMIN D 25 HYDROXY (VIT D DEFICIENCY, FRACTURES): VITD: 17.71 ng/mL — ABNORMAL LOW (ref 30.00–100.00)

## 2024-08-07 LAB — TSH: TSH: 1.59 u[IU]/mL (ref 0.35–5.50)

## 2024-08-07 LAB — FOLATE: Folate: 8.5 ng/mL (ref >5.9)

## 2024-08-07 NOTE — Assessment & Plan Note (Signed)
 Doing well! Commended her on regular exercise.  Will check lab work today including vitamin D , B1, folate, other labs.

## 2024-08-07 NOTE — Assessment & Plan Note (Signed)
 Controlled.  No concerns today.

## 2024-08-07 NOTE — Assessment & Plan Note (Signed)
 Unable to clearly palpate mass on exam today.  Will obtain plain films for further evaluation.  She agrees. Orders placed.

## 2024-08-07 NOTE — Progress Notes (Signed)
 Subjective:    Patient ID: Laura Gross, female    DOB: 05-29-1986, 38 y.o.   MRN: [id number]  Laura Gross is a very pleasant 38 y.o. female who presents today for complete physical and follow up of chronic conditions.  She would also like to mention sacral pain. About 1 year ago she noticed a bone like mass to the mid sacral region. She's noticed pain when sitting on firm surfaces. She has to use a soft pillow when sitting to reduce her pain. She denies injury. The mass is immobile and firm.   Immunizations: -Tetanus: Completed in 2001 -Influenza: Declines influenza vaccine.  -HPV: Never completed  Diet: Fair diet.  Exercise: Regular exercise 5 days weekly   Eye exam: Completes annually  Dental exam: Completes semi-annually    Pap Smear: Completed in 2021, she will schedule with OB/GYN  BP Readings from Last 3 Encounters:  08/07/24 134/60  10/08/23 118/68  08/01/23 130/86    Wt Readings from Last 3 Encounters:  08/07/24 153 lb (69.4 kg)  10/08/23 169 lb 8 oz (76.9 kg)  08/01/23 166 lb (75.3 kg)        Review of Systems  Constitutional:  Negative for unexpected weight change.  HENT:  Negative for rhinorrhea.   Respiratory:  Negative for cough and shortness of breath.   Cardiovascular:  Negative for chest pain.  Gastrointestinal:  Negative for constipation and diarrhea.  Genitourinary:  Negative for difficulty urinating.  Musculoskeletal:  Positive for arthralgias. Negative for myalgias.  Skin:  Negative for rash.  Allergic/Immunologic: Negative for environmental allergies.  Neurological:  Negative for dizziness and headaches.  Psychiatric/Behavioral:  The patient is not nervous/anxious.          Past Medical History:  Diagnosis Date   Chicken pox    Dizziness 08/01/2023   Encounter for infertility 07/19/2017   Family history of adverse reaction to anesthesia    ponv mother   Headache    History of UTI    Left supracondylar humerus  fracture 03/04/2020   Migraine    Morbid obesity (HCC) 06/26/2021   Obesity 08/14/2016    Social History   Socioeconomic History   Marital status: Married    Spouse name: Not on file   Number of children: Not on file   Years of education: Not on file   Highest education level: Bachelor's degree (e.g., BA, AB, BS)  Occupational History   Not on file  Tobacco Use   Smoking status: Never   Smokeless tobacco: Never  Vaping Use   Vaping status: Never Used  Substance and Sexual Activity   Alcohol use: Yes    Comment: occasional   Drug use: No   Sexual activity: Yes  Other Topics Concern   Not on file  Social History Narrative   Not on file   Social Drivers of Health   Financial Resource Strain: Low Risk  (08/02/2024)   Overall Financial Resource Strain (CARDIA)    Difficulty of Paying Living Expenses: Not hard at all  Food Insecurity: No Food Insecurity (08/02/2024)   Hunger Vital Sign    Worried About Running Out of Food in the Last Year: Never true    Ran Out of Food in the Last Year: Never true  Transportation Needs: No Transportation Needs (08/02/2024)   PRAPARE - Administrator, Civil Service (Medical): No    Lack of Transportation (Non-Medical): No  Physical Activity: Sufficiently Active (08/02/2024)   Exercise Vital Sign  Days of Exercise per Week: 5 days    Minutes of Exercise per Session: 60 min  Stress: No Stress Concern Present (08/02/2024)   [employer] of Occupational Health - Occupational Stress Questionnaire    Feeling of Stress: Only a little  Social Connections: Socially Integrated (08/02/2024)   Social Connection and Isolation Panel    Frequency of Communication with Friends and Family: More than three times a week    Frequency of Social Gatherings with Friends and Family: Three times a week    Attends Religious Services: More than 4 times per year    Active Member of Clubs or Organizations: Yes    Attends Banker  Meetings: More than 4 times per year    Marital Status: Married  Catering manager Violence: Not on file    Past Surgical History:  Procedure Laterality Date   APPENDECTOMY  yrs ago   FEMUR SURGERY Left    LAPAROSCOPIC GASTRIC SLEEVE RESECTION N/A 06/26/2021   Procedure: LAPAROSCOPIC GASTRIC SLEEVE RESECTION;  Surgeon: Signe Mitzie LABOR, MD;  Location: [facility];  Service: General;  Laterality: N/A;   ORIF HUMERUS FRACTURE Left 03/08/2020   Procedure: OPEN REDUCTION INTERNAL FIXATION (ORIF) HUMERAL SHAFT FRACTURE;  Surgeon: Beverley Evalene BIRCH, MD;  Location: [facility];  Service: Orthopedics;  Laterality: Left;   SPONTANEOUS HIP DISLOCATION REPAIR Left    TONSILLECTOMY  2008 or 2009   UPPER GI ENDOSCOPY N/A 06/26/2021   Procedure: UPPER GI ENDOSCOPY;  Surgeon: Signe Mitzie LABOR, MD;  Location: [facility];  Service: General;  Laterality: N/A;    Family History  Problem Relation Age of Onset   Diabetes Paternal Grandmother    Cancer Paternal Grandmother        lung   Hyperlipidemia Mother    Arthritis Mother    Cancer Maternal Grandmother        breast   Cancer Maternal Grandfather        nelanoma    Allergies  Allergen Reactions   Penicillins Hives    No current outpatient medications on file prior to visit.   No current facility-administered medications on file prior to visit.    BP 134/60   Pulse 78   Temp 98.6 F (37 C) (Oral)   Ht 5' 7 (1.702 m)   Wt 153 lb (69.4 kg)   LMP 08/02/2024   SpO2 98%   BMI 23.96 kg/m  Objective:   Physical Exam HENT:     Right Ear: Tympanic membrane and ear canal normal.     Left Ear: Tympanic membrane and ear canal normal.  Eyes:     Pupils: Pupils are equal, round, and reactive to light.  Cardiovascular:     Rate and Rhythm: Normal rate and regular rhythm.  Pulmonary:     Effort: Pulmonary effort is normal.     Breath sounds: Normal breath sounds.  Abdominal:     General: Bowel sounds are normal.     Palpations:  Abdomen is soft.     Tenderness: There is no abdominal tenderness.  Musculoskeletal:        General: Normal range of motion.     Cervical back: Neck supple.     Comments: Unable to completely palpate mass in question   Skin:    General: Skin is warm and dry.  Neurological:     Mental Status: She is alert and oriented to person, place, and time.     Cranial Nerves: No cranial nerve deficit.  Deep Tendon Reflexes:     Reflex Scores:      Patellar reflexes are 2+ on the right side and 2+ on the left side. Psychiatric:        Mood and Affect: Mood normal.     Physical Exam        Assessment & Plan:  Encounter for annual general medical examination with abnormal findings in adult Assessment & Plan: Declines influenza and tetanus vaccines. Pap smear due, offered today and she declines. She will set up with GYN  Discussed the importance of a healthy diet and regular exercise in order for weight loss, and to reduce the risk of further co-morbidity.  Exam stable. Labs pending.  Follow up in 1 year for repeat physical.    GAD (generalized anxiety disorder) Assessment & Plan: Controlled. No concerns today.   History of sleeve gastrectomy Assessment & Plan: Doing well! Commended her on regular exercise.  Will check lab work today including vitamin D , B1, folate, other labs.  Orders: -     Vitamin B12 -     VITAMIN D  25 Hydroxy (Vit-D Deficiency, Fractures) -     CBC -     Comprehensive metabolic panel with GFR -     Lipid panel -     Folate -     Vitamin B1 -     TSH  Migraine without aura and with status migrainosus, not intractable Assessment & Plan: Controlled.  No concerns today.   Sacral pain Assessment & Plan: Unable to clearly palpate mass on exam today.  Will obtain plain films for further evaluation.  She agrees. Orders placed.  Orders: -     DG Sacrum/Coccyx    Assessment and Plan Assessment & Plan         Comer MARLA Gaskins,  NP      History of Present Illness

## 2024-08-07 NOTE — Assessment & Plan Note (Signed)
 Declines influenza and tetanus vaccines. Pap smear due, offered today and she declines. She will set up with GYN  Discussed the importance of a healthy diet and regular exercise in order for weight loss, and to reduce the risk of further co-morbidity.  Exam stable. Labs pending.  Follow up in 1 year for repeat physical.

## 2024-08-07 NOTE — Patient Instructions (Signed)
Stop by the lab and xray prior to leaving today. I will notify you of your results once received.   It was a pleasure to see you today!   

## 2024-08-09 ENCOUNTER — Ambulatory Visit: Payer: Self-pay | Admitting: Primary Care

## 2024-08-09 DIAGNOSIS — M533 Sacrococcygeal disorders, not elsewhere classified: Secondary | ICD-10-CM

## 2024-08-09 DIAGNOSIS — E559 Vitamin D deficiency, unspecified: Secondary | ICD-10-CM

## 2024-08-12 LAB — VITAMIN B1: Vitamin B1 (Thiamine): 9 nmol/L (ref 8–30)

## 2024-08-12 MED ORDER — VITAMIN D (ERGOCALCIFEROL) 1.25 MG (50000 UNIT) PO CAPS: ORAL_CAPSULE | ORAL | 0 refills | Status: AC

## 2024-08-28 ENCOUNTER — Encounter: Payer: Self-pay | Admitting: *Deleted

## 2024-08-28 DIAGNOSIS — M1652 Unilateral post-traumatic osteoarthritis, left hip: Secondary | ICD-10-CM | POA: Diagnosis not present

## 2025-08-10 ENCOUNTER — Encounter: Admitting: Primary Care
